# Patient Record
Sex: Female | Born: 1965 | Race: White | Hispanic: No | Marital: Married | Smoking: Former smoker
Health system: Southern US, Community
[De-identification: ages and names within clinical notes are randomized; demographics above are authoritative.]

## PROBLEM LIST (undated history)

## (undated) DIAGNOSIS — Z86010 Personal history of colonic polyps: Secondary | ICD-10-CM

## (undated) DIAGNOSIS — F419 Anxiety disorder, unspecified: Secondary | ICD-10-CM

## (undated) DIAGNOSIS — K219 Gastro-esophageal reflux disease without esophagitis: Secondary | ICD-10-CM

## (undated) DIAGNOSIS — F32A Depression, unspecified: Secondary | ICD-10-CM

## (undated) DIAGNOSIS — D649 Anemia, unspecified: Secondary | ICD-10-CM

## (undated) DIAGNOSIS — E785 Hyperlipidemia, unspecified: Secondary | ICD-10-CM

## (undated) DIAGNOSIS — N900 Mild vulvar dysplasia: Secondary | ICD-10-CM

## (undated) DIAGNOSIS — E559 Vitamin D deficiency, unspecified: Secondary | ICD-10-CM

## (undated) DIAGNOSIS — Z9889 Other specified postprocedural states: Secondary | ICD-10-CM

## (undated) DIAGNOSIS — F329 Major depressive disorder, single episode, unspecified: Secondary | ICD-10-CM

## (undated) DIAGNOSIS — R112 Nausea with vomiting, unspecified: Secondary | ICD-10-CM

## (undated) DIAGNOSIS — H9190 Unspecified hearing loss, unspecified ear: Secondary | ICD-10-CM

## (undated) HISTORY — DX: Anxiety disorder, unspecified: F41.9

## (undated) HISTORY — PX: TONSILLECTOMY AND ADENOIDECTOMY: SHX28

## (undated) HISTORY — DX: Gastro-esophageal reflux disease without esophagitis: K21.9

## (undated) HISTORY — DX: Mild vulvar dysplasia: N90.0

## (undated) HISTORY — DX: Major depressive disorder, single episode, unspecified: F32.9

## (undated) HISTORY — PX: ABDOMINAL SURGERY: SHX537

## (undated) HISTORY — PX: ESOPHAGOGASTRODUODENOSCOPY: SHX1529

## (undated) HISTORY — DX: Hyperlipidemia, unspecified: E78.5

## (undated) HISTORY — DX: Anemia, unspecified: D64.9

## (undated) HISTORY — DX: Nausea with vomiting, unspecified: R11.2

## (undated) HISTORY — PX: LASIK: SHX215

## (undated) HISTORY — DX: Vitamin D deficiency, unspecified: E55.9

## (undated) HISTORY — PX: WISDOM TOOTH EXTRACTION: SHX21

## (undated) HISTORY — DX: Depression, unspecified: F32.A

## (undated) HISTORY — PX: CO2 LASER OF LEUKOPLAKIA: SHX1364

## (undated) HISTORY — PX: ABDOMINOPLASTY: SUR9

## (undated) HISTORY — DX: Other specified postprocedural states: Z98.890

## (undated) HISTORY — PX: HAND SURGERY: SHX662

## (undated) HISTORY — DX: Personal history of colonic polyps: Z86.010

## (undated) HISTORY — DX: Unspecified hearing loss, unspecified ear: H91.90

---

## 1997-12-21 ENCOUNTER — Inpatient Hospital Stay (HOSPITAL_COMMUNITY): Admission: AD | Admit: 1997-12-21 | Discharge: 1997-12-24 | Payer: Self-pay | Admitting: Gynecology

## 1999-02-02 ENCOUNTER — Other Ambulatory Visit: Admission: RE | Admit: 1999-02-02 | Discharge: 1999-02-02 | Payer: Self-pay | Admitting: Gynecology

## 2000-03-05 ENCOUNTER — Other Ambulatory Visit: Admission: RE | Admit: 2000-03-05 | Discharge: 2000-03-05 | Payer: Self-pay | Admitting: *Deleted

## 2000-04-23 HISTORY — PX: TUBAL LIGATION: SHX77

## 2000-09-23 ENCOUNTER — Encounter (INDEPENDENT_AMBULATORY_CARE_PROVIDER_SITE_OTHER): Payer: Self-pay | Admitting: Specialist

## 2000-09-23 ENCOUNTER — Inpatient Hospital Stay (HOSPITAL_COMMUNITY): Admission: AD | Admit: 2000-09-23 | Discharge: 2000-09-26 | Payer: Self-pay | Admitting: *Deleted

## 2000-09-26 ENCOUNTER — Encounter (HOSPITAL_COMMUNITY): Admission: RE | Admit: 2000-09-26 | Discharge: 2000-10-26 | Payer: Self-pay | Admitting: Gynecology

## 2000-11-06 ENCOUNTER — Other Ambulatory Visit: Admission: RE | Admit: 2000-11-06 | Discharge: 2000-11-06 | Payer: Self-pay | Admitting: Gynecology

## 2001-01-21 HISTORY — PX: OTHER SURGICAL HISTORY: SHX169

## 2002-01-13 ENCOUNTER — Other Ambulatory Visit: Admission: RE | Admit: 2002-01-13 | Discharge: 2002-01-13 | Payer: Self-pay | Admitting: Gynecology

## 2003-02-26 ENCOUNTER — Other Ambulatory Visit: Admission: RE | Admit: 2003-02-26 | Discharge: 2003-02-26 | Payer: Self-pay | Admitting: Gynecology

## 2004-03-20 ENCOUNTER — Other Ambulatory Visit: Admission: RE | Admit: 2004-03-20 | Discharge: 2004-03-20 | Payer: Self-pay | Admitting: Gynecology

## 2004-06-09 ENCOUNTER — Ambulatory Visit (HOSPITAL_BASED_OUTPATIENT_CLINIC_OR_DEPARTMENT_OTHER): Admission: RE | Admit: 2004-06-09 | Discharge: 2004-06-09 | Payer: Self-pay | Admitting: Gynecology

## 2005-02-01 ENCOUNTER — Ambulatory Visit (HOSPITAL_BASED_OUTPATIENT_CLINIC_OR_DEPARTMENT_OTHER): Admission: RE | Admit: 2005-02-01 | Discharge: 2005-02-01 | Payer: Self-pay | Admitting: Gynecology

## 2005-02-01 ENCOUNTER — Encounter (INDEPENDENT_AMBULATORY_CARE_PROVIDER_SITE_OTHER): Payer: Self-pay | Admitting: *Deleted

## 2006-05-15 ENCOUNTER — Other Ambulatory Visit: Admission: RE | Admit: 2006-05-15 | Discharge: 2006-05-15 | Payer: Self-pay | Admitting: Gynecology

## 2006-05-17 ENCOUNTER — Encounter: Admission: RE | Admit: 2006-05-17 | Discharge: 2006-05-17 | Payer: Self-pay | Admitting: Family Medicine

## 2007-05-22 ENCOUNTER — Other Ambulatory Visit: Admission: RE | Admit: 2007-05-22 | Discharge: 2007-05-22 | Payer: Self-pay | Admitting: Gynecology

## 2007-06-11 ENCOUNTER — Encounter: Admission: RE | Admit: 2007-06-11 | Discharge: 2007-06-11 | Payer: Self-pay | Admitting: Gynecology

## 2007-06-16 ENCOUNTER — Ambulatory Visit: Payer: Self-pay | Admitting: Internal Medicine

## 2007-06-27 ENCOUNTER — Ambulatory Visit: Payer: Self-pay | Admitting: Internal Medicine

## 2007-07-02 ENCOUNTER — Encounter: Admission: RE | Admit: 2007-07-02 | Discharge: 2007-07-02 | Payer: Self-pay | Admitting: Gynecology

## 2008-06-14 ENCOUNTER — Other Ambulatory Visit: Admission: RE | Admit: 2008-06-14 | Discharge: 2008-06-14 | Payer: Self-pay | Admitting: Gynecology

## 2008-06-14 ENCOUNTER — Ambulatory Visit: Payer: Self-pay | Admitting: Gynecology

## 2008-06-14 ENCOUNTER — Encounter: Payer: Self-pay | Admitting: Gynecology

## 2008-09-15 ENCOUNTER — Encounter: Admission: RE | Admit: 2008-09-15 | Discharge: 2008-09-15 | Payer: Self-pay | Admitting: Gynecology

## 2008-09-21 ENCOUNTER — Encounter: Admission: RE | Admit: 2008-09-21 | Discharge: 2008-09-21 | Payer: Self-pay | Admitting: Gynecology

## 2009-10-12 ENCOUNTER — Other Ambulatory Visit: Admission: RE | Admit: 2009-10-12 | Discharge: 2009-10-12 | Payer: Self-pay | Admitting: Gynecology

## 2009-10-12 ENCOUNTER — Ambulatory Visit: Payer: Self-pay | Admitting: Gynecology

## 2009-10-18 ENCOUNTER — Encounter: Admission: RE | Admit: 2009-10-18 | Discharge: 2009-10-18 | Payer: Self-pay | Admitting: Gynecology

## 2009-11-02 ENCOUNTER — Ambulatory Visit: Payer: Self-pay | Admitting: Gynecology

## 2009-11-28 ENCOUNTER — Ambulatory Visit: Payer: Self-pay | Admitting: Gynecology

## 2009-11-29 ENCOUNTER — Ambulatory Visit: Payer: Self-pay | Admitting: Gynecology

## 2009-11-29 HISTORY — PX: ENDOMETRIAL ABLATION: SHX621

## 2009-12-13 ENCOUNTER — Ambulatory Visit: Payer: Self-pay | Admitting: Gynecology

## 2010-05-14 ENCOUNTER — Encounter: Payer: Self-pay | Admitting: Family Medicine

## 2010-05-14 ENCOUNTER — Encounter: Payer: Self-pay | Admitting: Gynecology

## 2010-09-05 NOTE — Assessment & Plan Note (Signed)
[facility]                         GASTROENTEROLOGY OFFICE NOTE   NAME:[name], [name]                        MRN:          [id number]  DATE:06/16/2007                            DOB:          1966-04-01    CHIEF COMPLAINT:  Swallowing problems.   HISTORY:  This is a 45 year old white woman that for the past one to two  years has described some intermittent solid food dysphagia; in fact, she  has had to regurgitate at times.  There is a background of some  heartburn symptoms.  She has used occasional antacids in the past but  has not been on any chronic proton pump inhibitor.  She drinks about 2  cups of coffee in the morning but that is it for caffeine.  She has had  heartburn and indigestion in the past and in 1997 she had an upper  gastrointestinal series in [place] and she believes she had a small  hiatal hernia.  Other triggers of her reflux are red wine and spicy  foods.  Her father probably had gastroesophageal reflux disease and a  hiatal hernia as well.   PAST MEDICAL HISTORY:  Notable for depression for three years, on  fluoxetine and Wellbutrin.  She has had a tubal ligation and she has a  history of cervical polyps.   FAMILY HISTORY:  As above. Mother had heart disease.   SOCIAL HISTORY:  She is married.  She is a Network engineer of mine.  She has  two sons.  She is college educated.  She uses some alcohol, no tobacco  or drugs otherwise.   REVIEW OF SYSTEMS:  Occasional night sweats, hearing difficulty.  Her  last menstrual period was May 23, 2007.  All other systems negative.   PHYSICAL EXAMINATION:  GENERAL:  Well-developed, well-nourished, but  thin.  Height 5 feet.  Weight 124 pounds.  VITAL SIGNS:  Blood pressure 100/68, pulse 60.  HEENT:  Eyes anicteric.  ENT: Normal posterior pharynx.  NECK:  Supple.  No thyromegaly or masses.  CHEST:  Clear.  HEART:  S1, S2 with no murmurs, rubs or gallops.  ABDOMEN:  Soft, nontender, no  organomegaly or masses.  LYMPHATIC:  No neck adenopathy.  EXTREMITIES:  No edema.  SKIN:  No rash.  NEUROLOGICAL:  She is alert and oriented x3.   MEDICATIONS:  Other medications include a multivitamin as well as the  Wellbutrin and fluoxetine.   ASSESSMENT:  Dysphagia, chronic heartburn symptoms, intermittent.  She  may even have some odynophagia type symptoms.  The most likely etiology  is gastroesophageal reflux disease and this was explained to the  patient.  The possibility of eosinophilic esophagitis is mentioned and  I will keep that in mind.   RECOMMENDATIONS AND PLANS:  1. Nexium 40 mg daily, samples given.  2. Upper GI endoscopy, possible esophageal dilation or esophageal      biopsy depending upon what is seen.  This is planned for June 27, 2007.     Iva Boop, MD,FACG  Electronically Signed    CEG/MedQ  DD:  06/16/2007  DT: 06/17/2007  Job #: [id number]

## 2010-09-08 NOTE — Op Note (Signed)
[facility]  Patient:    [name], [name]                        MRN: [id number] Proc. Date: 09/23/00 Adm. Date:  [id number] Attending:  Tonye Royalty                           Operative Report  PREOPERATIVE DIAGNOSES:       1. Term intrauterine pregnancy.                               2. Previous cesarean section.                               3. Requests elective repeat cesarean section.                               4. Requests elective permanent sterilization.  POSTOPERATIVE DIAGNOSES:      1. Term intrauterine pregnancy.                               2. Previous cesarean section.                               3. Requests elective repeat cesarean section.                               4. Requests elective permanent sterilization.  OPERATION:                    1. Repeat low uterine transverse cesarean                                  section.                               2. Bilateral sterilization procedure, Pomeroy                                  technique.  SURGEON:                      Juan H. Lily Peer, M.D.  ASSISTANTMarcial Pacas P. Fontaine, M.D.  ANESTHESIA:                   Spinal.  INDICATIONS:                  A 45 year old gravida 2, para 1, at 74 weeks estimated gestational age with previous cesarean section requesting elective repeat.  Also, patient requests elective permanent sterilization.  FINDINGS:                     Viable female infant, Apgars 9/9 with a weight of 6 pounds 8 ounces.  Nuchal cord x 1.  Clear amniotic fluid.  Normal internal pelvic anatomy.  DESCRIPTION OF PROCEDURE:     After the patient was adequately counselled, she was taken to the operating room where she underwent successful spinal anesthesia placement.  Her abdomen was prepped and draped in the usual sterile fashion.  Foley catheter was inserted and left for monitoring urine output. After the abdomen was prepped and  draped, a Pfannenstiel skin incision was made adjacent to the previous Pfannenstiel scar.  The incision was carried down through skin and subcutaneous tissue down to the rectus fascia whereby midline nick was made.  The fascia was incised in a transverse fashion.  The peritoneal cavity was entered cautiously.  The bladder flap was established. The lower uterine segment was incised in transverse fashion.  Clear amniotic fluid was present.  The newborns head was delivered with assistance of mild traction under vacuum extractor.  The nuchal cord was released, and the newborn was delivered and gave immediate cry.  The above-mentioned parameters as described above were given by the pediatricians who were in attendance after the cord had been doubly clamped and excised and the newborn had been passed off to the pediatrician.  After cord blood was obtained, the placenta was delivered form the intrauterine cavity.  The intrauterine cavity was then swabbed clear of remaining products of conception.  The uterus was exteriorized and remaining products of conception removed.  The transverse lower uterine incision was closed in a single layer fashion with 0 Vicryl suture in a locking stitch manner.  After this, attention was placed on the left fallopian tube, proximal one-third portion where a 2 cm segment was ligated with 3-0 plain catgut suture x 2 and the 2 cm segment was excised and passed off the operative field and remaining stumps Bovie cauterized.  A similar procedure was carried out on the contralateral side.  The uterus was placed back in to the abdominal cavity.  The pelvic cavity was copiously irrigated with normal saline solution.  After ascertaining adequate hemostasis, the visceral peritoneum was not reapproximated, but the fascia was closed with a running stitch of 0 Vicryl suture.  The subcutaneous tissue was Bovie cauterized.  The skin was reapproximated with skin clips followed  by placement of Xeroform gauze and 4 x 4 dressing.  The patient was transferred to the recovery room with stable vital signs.  Blood loss from the procedure was 750 cc.  IV fluid was 2100 cc of lactated Ringers, and urine output was 50 to 75 cc and clear. DD:  09/23/00 TD:  09/23/00 Job: [id number]

## 2010-09-08 NOTE — H&P (Signed)
NAME:  [name], [name]                 ACCOUNT NO.:  [id number]   MEDICAL RECORD NO.:  [id number]          PATIENT TYPE:  AMB   LOCATION:  [facility]                         FACILITY:  [facility]   PHYSICIAN:  Juan H. Lily Peer, M.D.DATE OF BIRTH:  [birth year]   DATE OF ADMISSION:  DATE OF DISCHARGE:                                HISTORY & PHYSICAL   CHIEF COMPLAINT:  1.  Dysfunctional uterine bleeding.  2.  Endometrial polyps.   HISTORY:  The patient is a 45 year old, gravida 2, para 2 who has a prior  history of tubal sterilization at the time of her last C-section.  The  patient was seen in the office on 12/27/2004 for a preoperative consultation  for further evaluation of her ongoing postcoital bleeding.  When she was  seen in the office on 12/08/2004, she had a pedunculated polyp which was  removed from her cervix and submitted for histological evaluation.  Pathology report demonstrated that it was benign.  She underwent a  sonohystogram on 12/27/2004 which demonstrated 2 posterior wall defects, 1  measuring 10 x 6 mm and the other 1 was 7 x 8 mm suggestive of additional  endometrial polyps.  The right ovary had a thin wall liquid free cyst  measuring 11 x 11 mm and the left ovary had a small, thin walled cyst with  low level echoes measuring 11 x 8 mm.  The patient was placed on Megace 20  mg twice daily which she was to start taking 2 weeks before her surgery.   PAST MEDICAL HISTORY:   ALLERGIES:  She denies any allergies.   She has had a history of VEN I, status post CO2 laser ablation.  A patient  with two prior cesarean sections, at the last 1 having a bilateral tubal  sterilization.   FAMILY HISTORY:  A great aunt with ovarian cancer.  Her mother with  cardiovascular disease, and mother died of lung cancer.   MEDICATIONS:  1.  The patient takes Wellbutrin 300 mg daily.  2.  She takes Prozac.  3.  She is on multivitamins.   OTHER OPERATIONS:  Abdominoplasty in 2005.   PHYSICAL EXAMINATION:  The patient weighs 106 pounds, height 5 feet tall,  blood pressure 110/68.  HEENT:  Was unremarkable.  NECK:  Supple.  Trachea midline.  No carotid bruits.  No thyromegaly.  LUNGS:  Clear to auscultation without rhonchi or wheezes.  HEART:  Regular rate and rhythm.  No murmurs or gallops.  BREASTS:  Exam not done.  ABDOMEN:  Soft and nontender without rebound or guarding.  PELVIC:  Bartholin, urethra, and Skene glands within normal limits.  Vagina  and cervix:  No visual discharge.  Uterus:  Anteverted, normal size, shape,  and consistency.  Adnexa without masses or tenderness.  RECTAL:  Exam not done.   ASSESSMENT:  A 45 year old, gravida 2, para 2, previous sterilization  procedure.  A patient with dysfunctional uterine bleeding, recent cervical  polyp removed from external os which was benign.  Follow-up sonohystogram  with additional intracavitary and endometrial polyps.  The patient is  scheduled to undergo resectoscopic polypectomy, D&C at the [facility] on Thursday, October 12.  The patient was counseled as to  risks, benefits, pros and cons of the operation.  All questions were  answered.   PLAN:  The patient is scheduled for a resectoscopic polypectomy and D&C at  the [facility] on Thursday, October 12, at 8:30 a.m.      Juan H. Lily Peer, M.D.  Electronically Signed     JHF/MEDQ  D:  01/30/2005  T:  01/30/2005  Job:  [id number]

## 2010-09-08 NOTE — Op Note (Signed)
NAME:  [name], [name]                 ACCOUNT NO.:  [id number]   MEDICAL RECORD NO.:  [id number]          PATIENT TYPE:  AMB   LOCATION:  [facility]                         FACILITY:  [facility]   PHYSICIAN:  Juan H. 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   PREOPERATIVE DIAGNOSIS:  Vulvar intraepithelial neoplasia I.   POSTOPERATIVE DIAGNOSIS:  Vulvar intraepithelial neoplasia I.   ANESTHESIA:  General endotracheal anesthesia.   PROCEDURE PERFORMED:  CO2 laser ablation of vulvar intraepithelial  neoplasia.   SURGEON:  Juan H. Lily Peer, M.D.   ANESTHESIA:  General endotracheal anesthesia.   DESCRIPTION OF OPERATION:  After the patient was adequately counseled, she  was taken to the operating room where she was placed in the high lithotomy  position.  After general endotracheal anesthesia was obtained, the vulvar  area was protected with wet, moistened towels.  The colposcope was brought  into view.  Acetic acid was applied to the external genitalia for  visualization of the previously biopsied labia majora lesion.  It was  evident that there was an extensive hyperpigmented region running through  the entire labia majora.  The CO2 laser was set on 6 watts continuous mode  and with the hand-held piece, in a brush-like technique, the entire region  of the labia majora between labia majora and labia minora were ablated to a  depth of approximately 2 mm.  Afterwards, Silvadene cream was applied to the  area, and the patient was extubated, transferred to recovery room with  stable vital signs.  She received 30 mg of Toradol en route to the recovery  room.  Blood loss was minimal, and fluid resuscitation consisted of 300 mL  of lactated Ringer's.      JHF/MEDQ  D:  06/09/2004  T:  06/09/2004  Job:  [id number]

## 2010-09-08 NOTE — H&P (Signed)
[facility]  Patient:    [name], [name]                          MRN: [id number] Adm. Date:  09/23/00 Attending:  Gaetano Hawthorne. Lily Peer, M.D.                         History and Physical  CHIEF COMPLAINT:              1. Term intrauterine pregnancy.                               2. Previous cesarean section, request for                                  elective repeat.                               3. Request for elective tubal sterilization                                  procedure.  HISTORY OF PRESENT ILLNESS:   The patient is a 45 year old, gravida 2, para 1 with an estimated date of confinement October 02, 2000.  Patient will be [redacted] weeks gestation at the time of her scheduled repeat cesarean section.  Patient as part of her prenatal visits was counseled due to the fact that she would have been 45 years of age at the time of delivery of her baby and that she would be at increased risk for genetic and chromosomal abnormality based on advanced maternal age and was offered genetic amniocentesis and had declined although she accepted a maternal serum alpha-fetoprotein with its limitations in patients with advanced maternal age.  Her prenatal course has essentially been unremarkable with the exception that she had called on May 10 that her child at a day care had been exposed to a child with foot disease and wanted to be tested.  She had parvovirus B19 titers done which indicated old exposure with positive IgG but negative IgM.  She has voiced also at time of her repeat cesarean section that she would like to proceed with elective permanent sterilization for which she was counseled, as well.  PAST MEDICAL HISTORY:         Patient with previous cesarean section secondary to breech presentation.  Patient denies any allergies.  She has otherwise been in good health and no family members with any medical problems reported.  REVIEW OF SYSTEMS:            See  hospital form.  PHYSICAL EXAMINATION:  GENERAL:                      A well-developed, well-nourished female.  HEENT:                        Unremarkable.  NECK:                         Supple.  Trachea midline.  No carotid bruits. No thyromegaly.  LUNGS:  Clear to auscultation without rhonchi or wheezes.  HEART:                        Regular rate and rhythm.  No murmurs or gallops.  BREASTS:                      Examination done during the first trimester reported to be normal.  PELVIC:                       Gravid uterus, vertex presentation by Thayer Ohm maneuver.  Fundal height 38 cm.  Cervix long, closed, and posterior.  EXTREMITIES:                  DTRs 1+.  Negative clonus, trace edema.  PRENATAL LABS:                O positive blood type, negative antibody screen. VDRL was nonreactive.  Rubella was immune.  Hepatitis B surface antigen was negative.  Alpha-fetoprotein was negative.  Diabetes screen was normal. Patient did have evidence of anemia for which she was on supplemental iron during her pregnancy and her GBS culture was negative.  ASSESSMENT:                   A 45 year old, gravida 2, para 1 at 40 weeks estimated gestational age with previous cesarean section secondary to breech requesting elective repeat.  Patient also requesting elective permanent sterilization at time of repeat cesarean section.  Patient was counseled as to the risks, benefits, pros and cons, and failure rates from tubal sterilization procedure and also from repeat cesarean section.  All questions were answered and will follow accordingly.  Patient is aware that she will not be able to have any more children after tubal sterilization procedure.  PLAN:                         Patient is scheduled for a repeat lower uterine segment transverse cesarean section along with bilateral tubal sterilization procedure on Monday, June 3, at 9:15 a.m., at [facility]. DD:  09/19/00 TD:  09/19/00 Job: [id number]

## 2010-09-08 NOTE — Discharge Summary (Signed)
[facility]  Patient:    [name], [name]                          MRN: [id number] Adm. Date:  09/22/00 Disc. Date: 09/26/00 Attending:  Douglass Rivers, M.D. Dictator:   Antony Contras, [facility]                           Discharge Summary  DISCHARGE DIAGNOSES:          1. Term intrauterine pregnancy.                               2. Previous cesarean section.                               3. Requests elective repeat cesarean section.                               4. Requests elective permanent sterilization.  PROCEDURE:                    Repeat low transverse cesarean section, bilateral tubal sterilization Pomeroy technique with delivery of a viable infant.  HISTORY OF PRESENT ILLNESS:   The patient is a 45 year old, gravida 2, para 1-0-0-1, with an LMP of October 27, 1999, [facility] October 02, 2000.  Prenatal risk factors include history of previous cesarean section, also advanced maternal age.  The patient did decline amniocentesis, but did accept maternal serum alpha fetoprotein screening with its limitations.  She also was exposed to Parvovirus in late pregnancy and had titers done which indicated old exposure with positive IgG and negative IgM.  PRENATAL LABORATORY DATA:     Blood type O positive, antibody screen negative. RPR, HBSAG, and HIV nonreactive.  Rubella immune.  MSAFP normal.  GBS negative.  The patient had also requested elective permanent sterilization and had discussed this during her prenatal course.  HOSPITAL COURSE:              The patient presented on September 22, 2000.  Repeat low transverse cesarean section and bilateral sterilization procedure Pomeroy technique performed by Dr. Saunders Glance and assisted by Dr. Audie Box under spinal anesthesia.  Findings included a viable female infant, Apgars 9 and 9, with weight of 6 pounds 8 ounces, nuchal cord x 1.  Clear amniotic fluid.  Normal maternal anatomy.  Postoperative course, the patient remained  afebrile, had no difficulty voiding, and was able to be discharged on her third postoperative day in satisfactory condition.  CBC; hematocrit 33.7, hemoglobin 11.3, WBC 11.4, platelets 170.  DISPOSITION:                  Follow up in six weeks.  Continue prenatal vitamins and iron.  Motrin and Tylox for pain. DD:  10/25/00 TD:  10/25/00 Job: [id number]

## 2010-09-08 NOTE — Op Note (Signed)
NAME:  TRULA, FREDE                 ACCOUNT NO.:  [id number]   MEDICAL RECORD NO.:  [id number]          PATIENT TYPE:  AMB   LOCATION:  [facility]                         FACILITY:  [facility]   PHYSICIAN:  Juan H. Lily Peer, M.D.DATE OF BIRTH:  1965/04/26   DATE OF PROCEDURE:  02/01/2005  DATE OF DISCHARGE:                                 OPERATIVE REPORT   SURGEON:  Juan H. Lily Peer, M.D.   INDICATIONS FOR OPERATION:  A 45 year old, gravida 2, para 2 with prior  history of tubal sterilization, had postcoital bleeding and was worked and  she was found to have a pedunculated polyp which was removed from her  cervix, submitted for histologic evaluation. The pathology report  demonstrated that it was benign. She underwent s sonohysterogram which  demonstrated two posterior wall defects, one measuring 10 x 6 mm and the  other one measuring 7 x 8 mm suggestive of addition endometrial polyps. The  patient has been on Megace 20 mg b.i.d. for 2 weeks prior to her surgery.   PREOPERATIVE DIAGNOSES:  1.  Dysfunctional bleeding.  2.  Endometrial polyps.   POSTOPERATIVE DIAGNOSES:  1.  Dysfunctional bleeding.  2.  Endometrial polyps.   ANESTHESIA:  General endotracheal anesthesia.   PROCEDURE PERFORMED:  Resectoscopic polypectomy.   FINDINGS:  The patient had two small posterior uterine polyps. Both tubal  ostia were identified, normal cervical canal.   DESCRIPTION OF OPERATION:  After the patient was adequately counseled, she  was taken to the operating room where she underwent a successful general  endotracheal anesthesia. She had received a gram of Cefotan for prophylaxis.  The day before the patient had a laminaria placed intracervically to  facilitate insertion of the operative resectoscope. The cervix required no  dilatation, the vagina and perineum were prepped and draped in the usual  sterile fashion. A single tooth tenaculum was placed on the anterior  cervical lip. The operative  resectoscope with a 90 degree wire loop was  inserted into the internal cervical os and into the uterine cavity. Two  percent sorbitol was the distending media. A thorough inspection of the  intrauterine cavity demonstrated normal appearing tubal ostia, normal-  appearing fundus, two small polypoid type lesions were noted in the lower  uterine segment which was resected with a resectoscope and passed off the  operative field for histological evaluation. Its base was cauterized,  no other abnormalities were noted. The single tooth tenaculum was removed.  The patient was extubated, transferred to recovery room with stable vital  signs. Blood loss was minimal. Fluid resuscitation consisted of 800 mL  of  lactated Ringer's and she was to receive 30 mg of Toradol in route to the  recovery room.      Juan H. Lily Peer, M.D.  Electronically Signed     JHF/MEDQ  D:  02/01/2005  T:  02/01/2005  Job:  [id number]

## 2010-09-08 NOTE — H&P (Signed)
NAME:  [name], [name]                 ACCOUNT NO.:  [id number]   MEDICAL RECORD NO.:  [id number]          PATIENT TYPE:  AMB   LOCATION:  [facility]                         FACILITY:  [facility]   PHYSICIAN:  Juan H. Lily Peer, M.D.DATE OF BIRTH:  June 28, 1965   DATE OF ADMISSION:  DATE OF DISCHARGE:                                HISTORY & PHYSICAL   DATE OF ADMISSION:  Patient is scheduled for surgery Friday, June 09, 2004 at [facility].   CHIEF COMPLAINT:  Vulvar intraepithelial neoplasia.   HISTORY OF PRESENT ILLNESS:  The patient is a 45 year old gravida 2, para 2,  who has had a previous tubal sterilization procedure and had been seen at  the office for her annual examination on March 20, 2004 and brought to  the attention of the family practitioner that she had a pigmented area near  the left inner labia.  She was subsequently seen in the office on March 22, 2004 and high on the left labia majora there was a pigmented area that  was flat and on questioning, the patient did not ever see this except her  husband.  To rule out the possibility of underlying melanoma a Keyes punch  biopsy was done on the area and the report came back left labia major biopsy  with squamous mucosa with squamous hyperplasia and focal koilocytotic atypia  consistent with low grade vulvar intraepithelial neoplasia (VIN-I).  With  these findings the patient will be taken to the operating room for CO2 laser  ablation of vulvar intraepithelial neoplasia.   PAST MEDICAL HISTORY:  Patient denies any problems.   ALLERGIES:  Patient denies any allergies.   PAST SURGICAL HISTORY:  She had Cesarean section in 1999 and 2002 with  subsequent tubal sterilization.  Patient had an abdominoplasty in 2005.   MEDICATIONS:  Patient is on Wellbutrin as well and Prozac for her anxiety  and depression.  She takes multivitamins.   PHYSICAL EXAMINATION:  VITAL SIGNS:  Patient weighs 106 pounds, is 5 feet  tall.  Blood pressure 110/68.  HEENT:  Unremarkable.  NECK: Supple. Trachea midline.  No carotid bruits.  No thyromegaly.  LUNGS:  Clear to auscultation without rhonchi or wheezes.  HEART:  Regular rate and rhythm with no murmurs or gallops.  BREAST:  Examination not done.  ABDOMEN:  Soft, nontender without rebound or guarding.  PELVIC:  Bartholin's, urethral and Skene's glands within normal limits with  the exception of the area on the left inner labia majora where the biopsy  site had demonstrated VIN-I.  Bimanual examination unremarkable.   ASSESSMENT:  45 year old gravida 2, para 2 with left labia majora VIN-I,  will undergo laser cO2 ablation of vulvar intraepithelial neoplasia.  The  risks, benefits, pros and cons were discussed with the patient and all  questions were answered.  Will follow accordingly.   PLAN:  The patient is scheduled for cO2 laser ablation of the vulva for  dysplasia on Friday, June 09, 2004 at 7:30 A.M. at [facility].      JHF/MEDQ  D:  06/06/2004  T:  06/06/2004  Job:  [id number]

## 2010-11-27 ENCOUNTER — Other Ambulatory Visit: Payer: Self-pay | Admitting: Gynecology

## 2010-11-27 DIAGNOSIS — Z1231 Encounter for screening mammogram for malignant neoplasm of breast: Secondary | ICD-10-CM

## 2010-12-14 ENCOUNTER — Ambulatory Visit: Payer: Self-pay

## 2010-12-18 ENCOUNTER — Encounter: Payer: Self-pay | Admitting: Anesthesiology

## 2010-12-20 ENCOUNTER — Ambulatory Visit (INDEPENDENT_AMBULATORY_CARE_PROVIDER_SITE_OTHER): Payer: BC Managed Care – PPO | Admitting: Gynecology

## 2010-12-20 ENCOUNTER — Encounter: Payer: Self-pay | Admitting: Gynecology

## 2010-12-20 ENCOUNTER — Other Ambulatory Visit (HOSPITAL_COMMUNITY)
Admission: RE | Admit: 2010-12-20 | Discharge: 2010-12-20 | Disposition: A | Discharge status: Home / Self Care | Payer: BC Managed Care – PPO | Source: Ambulatory Visit | Attending: Gynecology | Admitting: Gynecology

## 2010-12-20 VITALS — BP 104/66

## 2010-12-20 DIAGNOSIS — Z01419 Encounter for gynecological examination (general) (routine) without abnormal findings: Secondary | ICD-10-CM

## 2010-12-20 DIAGNOSIS — R82998 Other abnormal findings in urine: Secondary | ICD-10-CM

## 2010-12-20 DIAGNOSIS — F329 Major depressive disorder, single episode, unspecified: Secondary | ICD-10-CM

## 2010-12-20 DIAGNOSIS — R634 Abnormal weight loss: Secondary | ICD-10-CM

## 2010-12-20 DIAGNOSIS — Z13220 Encounter for screening for lipoid disorders: Secondary | ICD-10-CM

## 2010-12-20 DIAGNOSIS — F32A Depression, unspecified: Secondary | ICD-10-CM

## 2010-12-20 NOTE — Progress Notes (Signed)
[name] 1965-10-10 [id number]   History:    45 y.o.  for annual exam and was asymptomatic. Patient is running on a regular basis. She is taken her calcium and vitamin D. Last year she was weighing 133 pounds and weighed today at 118 pounds.  She does her monthly self breast examination. Her last mammogram was in June of 2011. She scheduled a mammogram in the next few weeks. Patient is having regular cycles. She had a cryoablation for menorrhagia in the past. She's also has had a tubal sterilization procedure in the past as well. Past medical history,surgical history, family history and social history were all reviewed and documented in the EPIC chart. ROS:  Was performed and pertinent positives and negatives are included in the history.  Exam: chaperone present Filed Vitals:   12/20/10 1401  BP: 104/66   @WEIGHT @ There is no height or weight on file to calculate BMI.  General appearance : Well developed well nourished female. Skin grossly normal HEENT: Neck supple, trachea midline Lungs: Clear to auscultation, no rhonchi or wheezes Heart: Regular rate and rhythm, no murmurs or gallops Breast:Examined in sitting and supine position were symmetrical in appearance, no palpable masses, to skin retraction, no nipple inversion, no nipple discharge and no axillary or supraclavicular lymphadenopathy Abdomen: no palpable masses or tenderness scar from abdominoplasty evidence Pelvic  Ext/BUS/vagina  normal   Cervix  normal   Uterus  anteverted, normal size, shape and contour, midline and mobile nontender   Adnexa  Without masses or tenderness  Anus and perineum  normal   Rectovaginal  normal sphincter tone without palpated masses or tenderness             Hemoccult not done     Assessment/Plan:  45 y.o. female for annual exam unremarkable. Patient has history of depression and anxiety for which she has been on Wellbutrin. She will stop by the lab so we can check her CBC cholesterol random  blood sugar TSH urinalysis along with a Pap smear done today. She is instructed to continue her calcium and vitamin D twice a day continue her monthly self breast examination and will see her in one year or when necessary.    Ok Edwards MD, 2:53 PM 12/20/2010   Casper Harrison H MD, 2:53 PM 12/20/2010

## 2011-01-03 ENCOUNTER — Telehealth: Payer: Self-pay | Admitting: *Deleted

## 2011-01-03 DIAGNOSIS — E7800 Pure hypercholesterolemia, unspecified: Secondary | ICD-10-CM

## 2011-01-03 NOTE — Telephone Encounter (Signed)
Message copied by Aura Camps on Wed Jan 03, 2011  9:21 AM ------      Message from: Ok Edwards      Created: Wed Dec 27, 2010 12:11 PM       [name], please inform patient that she needs a fasting lipid profile due to the fact that her total cholesterol was elevated at 244 normal range is less than 200.

## 2011-01-03 NOTE — Telephone Encounter (Signed)
Pt informed with the below note, order in computer.

## 2011-01-08 ENCOUNTER — Other Ambulatory Visit: Payer: BC Managed Care – PPO

## 2011-01-18 ENCOUNTER — Ambulatory Visit
Admission: RE | Admit: 2011-01-18 | Discharge: 2011-01-18 | Disposition: A | Discharge status: Home / Self Care | Payer: BC Managed Care – PPO | Source: Ambulatory Visit | Attending: Gynecology | Admitting: Gynecology

## 2011-01-18 DIAGNOSIS — Z1231 Encounter for screening mammogram for malignant neoplasm of breast: Secondary | ICD-10-CM

## 2011-01-25 ENCOUNTER — Other Ambulatory Visit: Payer: Self-pay | Admitting: Gynecology

## 2011-01-25 ENCOUNTER — Other Ambulatory Visit: Payer: Self-pay | Admitting: *Deleted

## 2011-01-25 DIAGNOSIS — N6310 Unspecified lump in the right breast, unspecified quadrant: Secondary | ICD-10-CM

## 2011-02-07 ENCOUNTER — Other Ambulatory Visit: Payer: BC Managed Care – PPO

## 2011-02-21 ENCOUNTER — Ambulatory Visit
Admission: RE | Admit: 2011-02-21 | Discharge: 2011-02-21 | Disposition: A | Discharge status: Home / Self Care | Payer: BC Managed Care – PPO | Source: Ambulatory Visit | Attending: Obstetrics and Gynecology | Admitting: Obstetrics and Gynecology

## 2011-02-21 DIAGNOSIS — N6310 Unspecified lump in the right breast, unspecified quadrant: Secondary | ICD-10-CM

## 2012-03-07 ENCOUNTER — Other Ambulatory Visit: Payer: Self-pay | Admitting: Obstetrics and Gynecology

## 2012-03-07 ENCOUNTER — Other Ambulatory Visit: Payer: Self-pay | Admitting: Gynecology

## 2012-03-07 DIAGNOSIS — Z1231 Encounter for screening mammogram for malignant neoplasm of breast: Secondary | ICD-10-CM

## 2012-03-25 ENCOUNTER — Encounter: Payer: BC Managed Care – PPO | Admitting: Gynecology

## 2012-04-17 ENCOUNTER — Ambulatory Visit: Payer: BC Managed Care – PPO

## 2012-05-21 ENCOUNTER — Ambulatory Visit
Admission: RE | Admit: 2012-05-21 | Discharge: 2012-05-21 | Disposition: A | Discharge status: Home / Self Care | Payer: BC Managed Care – PPO | Source: Ambulatory Visit | Attending: Gynecology | Admitting: Gynecology

## 2012-05-21 DIAGNOSIS — Z1231 Encounter for screening mammogram for malignant neoplasm of breast: Secondary | ICD-10-CM

## 2012-05-22 ENCOUNTER — Other Ambulatory Visit: Payer: Self-pay | Admitting: Gynecology

## 2012-05-22 DIAGNOSIS — R928 Other abnormal and inconclusive findings on diagnostic imaging of breast: Secondary | ICD-10-CM

## 2012-05-26 ENCOUNTER — Other Ambulatory Visit: Payer: Self-pay | Admitting: *Deleted

## 2012-05-26 DIAGNOSIS — N6310 Unspecified lump in the right breast, unspecified quadrant: Secondary | ICD-10-CM

## 2012-05-28 ENCOUNTER — Other Ambulatory Visit (HOSPITAL_COMMUNITY)
Admission: RE | Admit: 2012-05-28 | Discharge: 2012-05-28 | Disposition: A | Discharge status: Home / Self Care | Payer: BC Managed Care – PPO | Source: Ambulatory Visit | Attending: Gynecology | Admitting: Gynecology

## 2012-05-28 ENCOUNTER — Encounter: Payer: Self-pay | Admitting: Gynecology

## 2012-05-28 ENCOUNTER — Ambulatory Visit (INDEPENDENT_AMBULATORY_CARE_PROVIDER_SITE_OTHER): Payer: BC Managed Care – PPO | Admitting: Gynecology

## 2012-05-28 VITALS — BP 104/68 | Ht 59.75 in | Wt 132.5 lb

## 2012-05-28 DIAGNOSIS — Z01419 Encounter for gynecological examination (general) (routine) without abnormal findings: Secondary | ICD-10-CM | POA: Insufficient documentation

## 2012-05-28 DIAGNOSIS — Z8639 Personal history of other endocrine, nutritional and metabolic disease: Secondary | ICD-10-CM

## 2012-05-28 DIAGNOSIS — Z1151 Encounter for screening for human papillomavirus (HPV): Secondary | ICD-10-CM | POA: Insufficient documentation

## 2012-05-28 DIAGNOSIS — N841 Polyp of cervix uteri: Secondary | ICD-10-CM

## 2012-05-28 DIAGNOSIS — N900 Mild vulvar dysplasia: Secondary | ICD-10-CM | POA: Insufficient documentation

## 2012-05-28 DIAGNOSIS — Z862 Personal history of diseases of the blood and blood-forming organs and certain disorders involving the immune mechanism: Secondary | ICD-10-CM

## 2012-05-28 LAB — CBC WITH DIFFERENTIAL/PLATELET
Basophils Absolute: 0 10*3/uL (ref 0.0–0.1)
Basophils Relative: 0 % (ref 0–1)
MCHC: 33.9 g/dL (ref 30.0–36.0)
Neutro Abs: 3.1 10*3/uL (ref 1.7–7.7)
Neutrophils Relative %: 58 % (ref 43–77)
RDW: 12.9 % (ref 11.5–15.5)

## 2012-05-28 NOTE — Progress Notes (Signed)
[name] 20-Oct-1965 [id number]   History:    47 y.o.  for annual gyn exam with no complaints today. Review of patient's records indicated that she has had history of resectoscopic polypectomy along with endometrial ablation. Patient is also had prior history of tubal sterilization procedure. Her cycles are regular and very light. Her mammogram January 2014 had a suspicious area on the right breast and patient is in the process of scheduling a followup ultrasound.  Review of her record indicated that in 2005 patient had VIN I of the left labia majora treated with CO2 laser. Patient also has had history in the past vitamin D deficiency as well as anemia. She is currently taking R. supplementation. Last Pap smear was normal in 2012. Patient denies any past history of abnormal Pap smears. Patient states flu vaccine is up-to-date. Patient not certain of her Tdap vaccine is up-to-date.  Past medical history,surgical history, family history and social history were all reviewed and documented in the EPIC chart.  Gynecologic History Patient's last menstrual period was 05/23/2012. Contraception: tubal ligation Last Pap: 2012. Results were: normal Last mammogram: 2014. Results were: See above  Obstetric History OB History    Grav Para Term Preterm Abortions TAB SAB Ect Mult Living   2 2        2      # Outc Date GA Lbr Len/2nd Wgt Sex Del Anes PTL Lv   1 PAR            2 PAR                ROS: A ROS was performed and pertinent positives and negatives are included in the history.  GENERAL: No fevers or chills. HEENT: No change in vision, no earache, sore throat or sinus congestion. NECK: No pain or stiffness. CARDIOVASCULAR: No chest pain or pressure. No palpitations. PULMONARY: No shortness of breath, cough or wheeze. GASTROINTESTINAL: No abdominal pain, nausea, vomiting or diarrhea, melena or bright red blood per rectum. GENITOURINARY: No urinary frequency, urgency, hesitancy or dysuria.  MUSCULOSKELETAL: No joint or muscle pain, no back pain, no recent trauma. DERMATOLOGIC: No rash, no itching, no lesions. ENDOCRINE: No polyuria, polydipsia, no heat or cold intolerance. No recent change in weight. HEMATOLOGICAL: No anemia or easy bruising or bleeding. NEUROLOGIC: No headache, seizures, numbness, tingling or weakness. PSYCHIATRIC: No depression, no loss of interest in normal activity or change in sleep pattern.     Exam: chaperone present  BP 104/68  Ht 4' 11.75" (1.518 m)  Wt 132 lb 8 oz (60.102 kg)  BMI 26.09 kg/m2  LMP 05/23/2012  Body mass index is 26.09 kg/(m^2).  General appearance : Well developed well nourished female. No acute distress HEENT: Neck supple, trachea midline, no carotid bruits, no thyroidmegaly Lungs: Clear to auscultation, no rhonchi or wheezes, or rib retractions  Heart: Regular rate and rhythm, no murmurs or gallops Breast:Examined in sitting and supine position were symmetrical in appearance, no palpable masses or tenderness,  no skin retraction, no nipple inversion, no nipple discharge, no skin discoloration, no axillary or supraclavicular lymphadenopathy Abdomen: no palpable masses or tenderness, no rebound or guarding Extremities: no edema or skin discoloration or tenderness  Pelvic:  Bartholin, Urethra, Skene Glands: Within normal limits             Vagina: No gross lesions or discharge  Cervix: No gross lesions or discharge, cervical polyp  Uterus  anteverted, normal size, shape and consistency, non-tender and mobile  Adnexa  Without masses or tenderness  Anus and perineum  normal   Rectovaginal  normal sphincter tone without palpated masses or tenderness             Hemoccult not done     Assessment/Plan:  47 y.o. female for annual exam with a cervical polyp. She will return to the office in one to 2 weeks for sonohysterogram and we will remove the cervical polyp at that time. Pap smear was done today. The following labs were ordered  today: Vitamin D and CBC. Her primary physician at [facility] recently did her labs. She will check with them if she has had her Tdap vaccine. She was reminded to followup with a recommended ultrasound for the suspicious area on the right breast noted on mammogram recently.    Ok Edwards MD, 2:07 PM 05/28/2012

## 2012-05-28 NOTE — Patient Instructions (Addendum)

## 2012-05-29 ENCOUNTER — Encounter: Payer: Self-pay | Admitting: Gynecology

## 2012-05-29 LAB — VITAMIN D 25 HYDROXY (VIT D DEFICIENCY, FRACTURES): Vit D, 25-Hydroxy: 37 ng/mL (ref 30–89)

## 2012-06-04 ENCOUNTER — Ambulatory Visit
Admission: RE | Admit: 2012-06-04 | Discharge: 2012-06-04 | Disposition: A | Discharge status: Home / Self Care | Payer: BC Managed Care – PPO | Source: Ambulatory Visit | Attending: Gynecology | Admitting: Gynecology

## 2012-06-04 DIAGNOSIS — N6310 Unspecified lump in the right breast, unspecified quadrant: Secondary | ICD-10-CM

## 2012-06-06 ENCOUNTER — Ambulatory Visit: Payer: BC Managed Care – PPO | Admitting: Gynecology

## 2012-06-06 ENCOUNTER — Other Ambulatory Visit: Payer: BC Managed Care – PPO

## 2012-06-23 ENCOUNTER — Other Ambulatory Visit: Payer: BC Managed Care – PPO

## 2012-06-23 ENCOUNTER — Ambulatory Visit: Payer: BC Managed Care – PPO | Admitting: Gynecology

## 2012-07-02 ENCOUNTER — Other Ambulatory Visit: Payer: Self-pay | Admitting: Gynecology

## 2012-07-02 DIAGNOSIS — D251 Intramural leiomyoma of uterus: Secondary | ICD-10-CM

## 2012-07-02 DIAGNOSIS — N8300 Follicular cyst of ovary, unspecified side: Secondary | ICD-10-CM

## 2012-07-02 DIAGNOSIS — D259 Leiomyoma of uterus, unspecified: Secondary | ICD-10-CM

## 2012-07-02 DIAGNOSIS — N841 Polyp of cervix uteri: Secondary | ICD-10-CM

## 2012-07-07 ENCOUNTER — Ambulatory Visit: Payer: BC Managed Care – PPO | Admitting: Gynecology

## 2012-07-07 ENCOUNTER — Encounter: Payer: Self-pay | Admitting: Gynecology

## 2012-07-07 ENCOUNTER — Ambulatory Visit: Payer: BC Managed Care – PPO

## 2012-07-07 DIAGNOSIS — D251 Intramural leiomyoma of uterus: Secondary | ICD-10-CM

## 2012-07-07 DIAGNOSIS — N841 Polyp of cervix uteri: Secondary | ICD-10-CM

## 2012-07-07 DIAGNOSIS — D259 Leiomyoma of uterus, unspecified: Secondary | ICD-10-CM

## 2012-07-07 DIAGNOSIS — N8300 Follicular cyst of ovary, unspecified side: Secondary | ICD-10-CM

## 2012-07-07 NOTE — Progress Notes (Signed)
Patient presented to the office today for sonohysterogram as a result of an incidental finding at time of her annual exam with every fifth 2014 whereby cervical polyp was noted. Review of patient's records indicated that she has had history of resectoscopic polypectomy along with endometrial ablation. Patient is also had prior history of tubal sterilization procedure. Her cycles are regular and very light. Patient's recent labs consisting of vitamin D and CBC were within normal limits.  Ultrasound today: Uterus measured 8.8 x 5.3 x 4.1 cm transvaginal ultrasound and sonohysterogram demonstrated no intrauterine defect. Patient with history of endometrial ablation the past. 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 Right and left ovary otherwise normal.  The cervix was cleansed with Betadine solution. A ring forcep was placed on the base of the cervical polyp was twisted off its pedicle and submitted for histological evaluation. Silver nitrate was used for hemostasis.  Patient will be notified with the pathology report when it becomes available otherwise we will see her back in one year or when necessary.

## 2013-05-29 ENCOUNTER — Ambulatory Visit (INDEPENDENT_AMBULATORY_CARE_PROVIDER_SITE_OTHER): Payer: BC Managed Care – PPO | Admitting: Gynecology

## 2013-05-29 ENCOUNTER — Encounter: Payer: Self-pay | Admitting: Gynecology

## 2013-05-29 VITALS — BP 104/68 | Ht 59.5 in | Wt 134.0 lb

## 2013-05-29 DIAGNOSIS — Z8639 Personal history of other endocrine, nutritional and metabolic disease: Secondary | ICD-10-CM

## 2013-05-29 DIAGNOSIS — Z01419 Encounter for gynecological examination (general) (routine) without abnormal findings: Secondary | ICD-10-CM

## 2013-05-29 DIAGNOSIS — Z1159 Encounter for screening for other viral diseases: Secondary | ICD-10-CM

## 2013-05-29 NOTE — Progress Notes (Signed)
[name] 12/09/1965 [id number]   History:    48 y.o.  for annual gyn exam with no complaints today.Review of patient's records indicated that she has had history of resectoscopic polypectomy along with endometrial ablation. Patient is also had prior history of tubal sterilization procedure. Her cycles are regular and very light.  Review of her record indicated that in 2005 patient had VIN I of the left labia majora treated with CO2 laser. Patient also has had history in the past vitamin D deficiency as well as anemia. She is currently taking R. supplementation. Last Pap smear was normal in 2012. Patient denies any past history of abnormal Pap smears. Patient states flu vaccine is up-to-date.   Past medical history,surgical history, family history and social history were all reviewed and documented in the EPIC chart.  Gynecologic History Patient's last menstrual period was 05/14/2013. Contraception: tubal ligation Last Pap: 2014. Results were: normal Last mammogram: 2014. Results were: normal  Obstetric History OB History  Gravida Para Term Preterm AB SAB TAB Ectopic Multiple Living  2 2        2     # Outcome Date GA Lbr Len/2nd Weight Sex Delivery Anes PTL Lv  2 PAR           1 PAR                ROS: A ROS was performed and pertinent positives and negatives are included in the history.  GENERAL: No fevers or chills. HEENT: No change in vision, no earache, sore throat or sinus congestion. NECK: No pain or stiffness. CARDIOVASCULAR: No chest pain or pressure. No palpitations. PULMONARY: No shortness of breath, cough or wheeze. GASTROINTESTINAL: No abdominal pain, nausea, vomiting or diarrhea, melena or bright red blood per rectum. GENITOURINARY: No urinary frequency, urgency, hesitancy or dysuria. MUSCULOSKELETAL: No joint or muscle pain, no back pain, no recent trauma. DERMATOLOGIC: No rash, no itching, no lesions. ENDOCRINE: No polyuria, polydipsia, no heat or cold intolerance.  No recent change in weight. HEMATOLOGICAL: No anemia or easy bruising or bleeding. NEUROLOGIC: No headache, seizures, numbness, tingling or weakness. PSYCHIATRIC: No depression, no loss of interest in normal activity or change in sleep pattern.     Exam: chaperone present  BP 104/68  Ht 4' 11.5" (1.511 m)  Wt 134 lb (60.782 kg)  BMI 26.62 kg/m2  LMP 05/14/2013  Body mass index is 26.62 kg/(m^2).  General appearance : Well developed well nourished female. No acute distress HEENT: Neck supple, trachea midline, no carotid bruits, no thyroidmegaly Lungs: Clear to auscultation, no rhonchi or wheezes, or rib retractions  Heart: Regular rate and rhythm, no murmurs or gallops Breast:Examined in sitting and supine position were symmetrical in appearance, no palpable masses or tenderness,  no skin retraction, no nipple inversion, no nipple discharge, no skin discoloration, no axillary or supraclavicular lymphadenopathy Abdomen: no palpable masses or tenderness, no rebound or guarding Extremities: no edema or skin discoloration or tenderness  Pelvic:  Bartholin, Urethra, Skene Glands: Within normal limits             Vagina: No gross lesions or discharge  Cervix: No gross lesions or discharge  Uterus  anteverted, normal size, shape and consistency, non-tender and mobile  Adnexa  Without masses or tenderness  Anus and perineum  normal   Rectovaginal  normal sphincter tone without palpated masses or tenderness             Hemoccult indicating  Assessment/Plan:  48 y.o. female for annual exam who is counseled today on the detrimental effects of smoking although she smokes only 4 cigarettes during the weekend. Patient does not want to go on any treatment at this time says that she can quit at any time. The new Pap smear guidelines were discussed. Pap smear not done today. Her flu vaccine is up-to-date. The following labs will be ordered so she can come in the fasting state next week: Fasting  lipid profile, comprehensive metabolic panel, CBC, vitamin D level and urinalysis. Patient was reminded to schedule her mammogram the next few weeks since is overdue. She was reminded to do her monthly breast exam.  New CDC guidelines is recommending patients be tested once in her lifetime for hepatitis C antibody who were born between [birth year] through 1965. This was discussed with the patient today and has agreed to be tested today.  Note: This dictation was prepared with  Dragon/digital dictation along withSmart phrase technology. Any transcriptional errors that result from this process are unintentional.   Terrance Mass MD, 11:31 AM 05/29/2013

## 2013-05-30 LAB — URINALYSIS W MICROSCOPIC + REFLEX CULTURE
Bilirubin Urine: NEGATIVE
CASTS: NONE SEEN
Crystals: NONE SEEN
Glucose, UA: NEGATIVE mg/dL
HGB URINE DIPSTICK: NEGATIVE
KETONES UR: NEGATIVE mg/dL
Leukocytes, UA: NEGATIVE
NITRITE: NEGATIVE
PROTEIN: NEGATIVE mg/dL
Specific Gravity, Urine: 1.021 (ref 1.005–1.030)
Squamous Epithelial / LPF: NONE SEEN
UROBILINOGEN UA: 0.2 mg/dL (ref 0.0–1.0)
pH: 5.5 (ref 5.0–8.0)

## 2013-08-17 ENCOUNTER — Other Ambulatory Visit: Payer: Self-pay

## 2013-08-17 DIAGNOSIS — Z1231 Encounter for screening mammogram for malignant neoplasm of breast: Secondary | ICD-10-CM

## 2013-09-03 ENCOUNTER — Ambulatory Visit: Payer: BC Managed Care – PPO

## 2013-10-06 ENCOUNTER — Encounter (INDEPENDENT_AMBULATORY_CARE_PROVIDER_SITE_OTHER): Payer: Self-pay

## 2013-10-06 ENCOUNTER — Ambulatory Visit
Admission: RE | Admit: 2013-10-06 | Discharge: 2013-10-06 | Disposition: A | Discharge status: Home / Self Care | Payer: BC Managed Care – PPO | Source: Ambulatory Visit

## 2013-10-06 DIAGNOSIS — Z1231 Encounter for screening mammogram for malignant neoplasm of breast: Secondary | ICD-10-CM

## 2013-12-24 ENCOUNTER — Encounter: Payer: Self-pay | Admitting: Internal Medicine

## 2014-02-05 ENCOUNTER — Other Ambulatory Visit: Payer: Self-pay

## 2014-02-22 ENCOUNTER — Encounter: Payer: Self-pay | Admitting: Gynecology

## 2014-06-23 ENCOUNTER — Ambulatory Visit (INDEPENDENT_AMBULATORY_CARE_PROVIDER_SITE_OTHER): Payer: BLUE CROSS/BLUE SHIELD | Admitting: Gynecology

## 2014-06-23 ENCOUNTER — Encounter: Payer: Self-pay | Admitting: Gynecology

## 2014-06-23 VITALS — BP 120/78 | Ht 60.0 in | Wt 140.0 lb

## 2014-06-23 DIAGNOSIS — Z8639 Personal history of other endocrine, nutritional and metabolic disease: Secondary | ICD-10-CM

## 2014-06-23 DIAGNOSIS — Z8659 Personal history of other mental and behavioral disorders: Secondary | ICD-10-CM

## 2014-06-23 DIAGNOSIS — N900 Mild vulvar dysplasia: Secondary | ICD-10-CM | POA: Diagnosis not present

## 2014-06-23 DIAGNOSIS — Z01419 Encounter for gynecological examination (general) (routine) without abnormal findings: Secondary | ICD-10-CM

## 2014-06-23 DIAGNOSIS — N951 Menopausal and female climacteric states: Secondary | ICD-10-CM | POA: Diagnosis not present

## 2014-06-23 LAB — CBC WITH DIFFERENTIAL/PLATELET
BASOS ABS: 0 10*3/uL (ref 0.0–0.1)
Basophils Relative: 0 % (ref 0–1)
EOS ABS: 0.1 10*3/uL (ref 0.0–0.7)
Eosinophils Relative: 2 % (ref 0–5)
HCT: 41.5 % (ref 36.0–46.0)
HEMOGLOBIN: 13.5 g/dL (ref 12.0–15.0)
LYMPHS ABS: 1.8 10*3/uL (ref 0.7–4.0)
Lymphocytes Relative: 24 % (ref 12–46)
MCH: 31 pg (ref 26.0–34.0)
MCHC: 32.5 g/dL (ref 30.0–36.0)
MCV: 95.2 fL (ref 78.0–100.0)
MONO ABS: 0.4 10*3/uL (ref 0.1–1.0)
MONOS PCT: 6 % (ref 3–12)
MPV: 10.4 fL (ref 8.6–12.4)
Neutro Abs: 5 10*3/uL (ref 1.7–7.7)
Neutrophils Relative %: 68 % (ref 43–77)
Platelets: 293 10*3/uL (ref 150–400)
RBC: 4.36 MIL/uL (ref 3.87–5.11)
RDW: 13.1 % (ref 11.5–15.5)
WBC: 7.3 10*3/uL (ref 4.0–10.5)

## 2014-06-23 LAB — TSH: TSH: 1.294 u[IU]/mL (ref 0.350–4.500)

## 2014-06-23 MED ORDER — FLUOXETINE HCL 10 MG PO CAPS: 20.0000 mg | ORAL_CAPSULE | Freq: Every day | ORAL | Status: DC

## 2014-06-23 MED ORDER — BUPROPION HCL ER (XL) 300 MG PO TB24: 300.0000 mg | ORAL_TABLET | Freq: Every day | ORAL | Status: DC

## 2014-06-23 NOTE — Progress Notes (Signed)
[name] 1966/02/22 [id number]   History:    49 y.o.  for annual gyn exam with the only complaint being that her cycle is late for 12 days. She has suffer from occasional vasomotor symptoms. Review of patient's records indicated that she has had history of resectoscopic polypectomy along with endometrial ablation. Patient is also had prior history of tubal sterilization procedure. 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 Patient also has had history in the past vitamin D deficiency as well as anemia. Patient denies any past history of any abnormal Pap smear. Her vaccines are up-to-date. Last year she forgot to come to the office for fasting lab work. Patient currently being followed monthly by her therapist who has recommended that she take Wellbutrin and Prozac daily for which she was requesting refill today.  Past medical history,surgical history, family history and social history were all reviewed and documented in the EPIC chart.  Gynecologic History Patient's last menstrual period was 05/15/2014. Contraception: tubal ligation Last Pap: 2014. Results were: normal Last mammogram: 2015. Results were: normal  Obstetric History OB History  Gravida Para Term Preterm AB SAB TAB Ectopic Multiple Living  2 2        2     # Outcome Date GA Lbr Len/2nd Weight Sex Delivery Anes PTL Lv  2 Para           1 Para                ROS: A ROS was performed and pertinent positives and negatives are included in the history.  GENERAL: No fevers or chills. HEENT: No change in vision, no earache, sore throat or sinus congestion. NECK: No pain or stiffness. CARDIOVASCULAR: No chest pain or pressure. No palpitations. PULMONARY: No shortness of breath, cough or wheeze. GASTROINTESTINAL: No abdominal pain, nausea, vomiting or diarrhea, melena or bright red blood per rectum.  GENITOURINARY: No urinary frequency, urgency, hesitancy or dysuria. MUSCULOSKELETAL: No joint or muscle pain, no back pain, no recent trauma. DERMATOLOGIC: No rash, no itching, no lesions. ENDOCRINE: No polyuria, polydipsia, no heat or cold intolerance. No recent change in weight. HEMATOLOGICAL: No anemia or easy bruising or bleeding. NEUROLOGIC: No headache, seizures, numbness, tingling or weakness. PSYCHIATRIC: No depression, no loss of interest in normal activity or change in sleep pattern.     Exam: chaperone present  BP 120/78 mmHg  Ht 5' (1.524 m)  Wt 140 lb (63.504 kg)  BMI 27.34 kg/m2  LMP 05/15/2014  Body mass index is 27.34 kg/(m^2).  General appearance : Well developed well nourished female. No acute distress HEENT: Eyes: no retinal hemorrhage or exudates,  Neck supple, trachea midline, no carotid bruits, no thyroidmegaly Lungs: Clear to auscultation, no rhonchi or wheezes, or rib retractions  Heart: Regular rate and rhythm, no murmurs or gallops Breast:Examined in sitting and supine position were symmetrical in appearance, no palpable masses or tenderness,  no skin retraction, no nipple inversion, no nipple discharge, no skin discoloration, no axillary or supraclavicular lymphadenopathy Abdomen: no palpable masses or tenderness, no rebound or guarding Extremities: no edema or skin discoloration or tenderness  Pelvic:  Bartholin, Urethra, Skene Glands: Within normal limits             Vagina: No gross lesions or discharge  Cervix: No gross lesions or discharge  Uterus  anteverted, normal size, shape  and consistency, non-tender and mobile  Adnexa  Without masses or tenderness  Anus and perineum  normal   Rectovaginal  normal sphincter tone without palpated masses or tenderness             Hemoccult not indicated     Assessment/Plan:  49 y.o. female for annual exam who appears to be perimenopausal. We will check an [place] level today along with the following screening blood  work: Comprehensive metabolic panel, TSH, fasting lipid profile, CBC, and urinalysis. Because of her past history vitamin D deficiency will check a vitamin D level today. Patient will not need a Pap smear until next year. She was reminded to do her monthly breast exams. She was reminded to schedule her mammogram this year. We discussed importance of calcium vitamin D and regular exercise for osteoporosis prevention.   Terrance Mass MD, 12:35 PM 06/23/2014

## 2014-06-23 NOTE — Patient Instructions (Signed)
Hormone Therapy At menopause, your body begins making less estrogen and progesterone hormones. This causes the body to stop having menstrual periods. This is because estrogen and progesterone hormones control your periods and menstrual cycle. A lack of estrogen may cause symptoms such as:  Hot flushes (or hot flashes).  Vaginal dryness.  Dry skin.  Loss of sex drive.  Risk of bone loss (osteoporosis). When this happens, you may choose to take hormone therapy to get back the estrogen lost during menopause. When the hormone estrogen is given alone, it is usually referred to as ET (Estrogen Therapy). When the hormone progestin is combined with estrogen, it is generally called HT (Hormone Therapy). This was formerly known as hormone replacement therapy (HRT). Your caregiver can help you make a decision on what will be best for you. The decision to use HT seems to change often as new studies are done. Many studies do not agree on the benefits of hormone replacement therapy. LIKELY BENEFITS OF HT INCLUDE PROTECTION FROM:  Hot Flushes (also called hot flashes) - A hot flush is a sudden feeling of heat that spreads over the face and body. The skin may redden like a blush. It is connected with sweats and sleep disturbance. Women going through menopause may have hot flushes a few times a month or several times per day depending on the woman.  Osteoporosis (bone loss)- Estrogen helps guard against bone loss. After menopause, a woman's bones slowly lose calcium and become weak and brittle. As a result, bones are more likely to break. The hip, wrist, and spine are affected most often. Hormone therapy can help slow bone loss after menopause. Weight bearing exercise and taking calcium with vitamin D also can help prevent bone loss. There are also medications that your caregiver can prescribe that can help prevent osteoporosis.  Vaginal Dryness - Loss of estrogen causes changes in the vagina. Its lining may  become thin and dry. These changes can cause pain and bleeding during sexual intercourse. Dryness can also lead to infections. This can cause burning and itching. (Vaginal estrogen treatment can help relieve pain, itching, and dryness.)  Urinary Tract Infections are more common after menopause because of lack of estrogen. Some women also develop urinary incontinence because of low estrogen levels in the vagina and bladder.  Possible other benefits of estrogen include a positive effect on mood and short-term memory in women. RISKS AND COMPLICATIONS  Using estrogen alone without progesterone causes the lining of the uterus to grow. This increases the risk of lining of the uterus (endometrial) cancer. Your caregiver should give another hormone called progestin if you have a uterus.  Women who take combined (estrogen and progestin) HT appear to have an increased risk of breast cancer. The risk appears to be small, but increases throughout the time that HT is taken.  Combined therapy also makes the breast tissue slightly denser which makes it harder to read mammograms (breast X-rays).  Combined, estrogen and progesterone therapy can be taken together every day, in which case there may be spotting of blood. HT therapy can be taken cyclically in which case you will have menstrual periods. Cyclically means HT is taken for a set amount of days, then not taken, then this process is repeated.  HT may increase the risk of stroke, heart attack, breast cancer and forming blood clots in your leg.  Transdermal estrogen (estrogen that is absorbed through the skin with a patch or a cream) may have more positive results with:    Cholesterol.  Blood pressure.  Blood clots. Having the following conditions may indicate you should not have HT:  Endometrial cancer.  Liver disease.  Breast cancer.  Heart disease.  History of blood clots.  Stroke. TREATMENT   If you choose to take HT and have a uterus,  usually estrogen and progestin are prescribed.  Your caregiver will help you decide the best way to take the medications.  Possible ways to take estrogen include:  Pills.  Patches.  Gels.  Sprays.  Vaginal estrogen cream, rings and tablets.  It is best to take the lowest dose possible that will help your symptoms and take them for the shortest period of time that you can.  Hormone therapy can help relieve some of the problems (symptoms) that affect women at menopause. Before making a decision about HT, talk to your caregiver about what is best for you. Be well informed and comfortable with your decisions. HOME CARE INSTRUCTIONS   Follow your caregivers advice when taking the medications.  A Pap test is done to screen for cervical cancer.  The first Pap test should be done at age 21.  Between ages 21 and 29, Pap tests are repeated every 2 years.  Beginning at age 30, you are advised to have a Pap test every 3 years as long as your past 3 Pap tests have been normal.  Some women have medical problems that increase the chance of getting cervical cancer. Talk to your caregiver about these problems. It is especially important to talk to your caregiver if a new problem develops soon after your last Pap test. In these cases, your caregiver may recommend more frequent screening and Pap tests.  The above recommendations are the same for women who have or have not gotten the vaccine for HPV (Human Papillomavirus).  If you had a hysterectomy for a problem that was not a cancer or a condition that could lead to cancer, then you no longer need Pap tests. However, even if you no longer need a Pap test, a regular exam is a good idea to make sure no other problems are starting.   If you are between ages 65 and 70, and you have had normal Pap tests going back 10 years, you no longer need Pap tests. However, even if you no longer need a Pap test, a regular exam is a good idea to make sure no  other problems are starting.   If you have had past treatment for cervical cancer or a condition that could lead to cancer, you need Pap tests and screening for cancer for at least 20 years after your treatment.  If Pap tests have been discontinued, risk factors (such as a new sexual partner) need to be re-assessed to determine if screening should be resumed.  Some women may need screenings more often if they are at high risk for cervical cancer.  Get mammograms done as per the advice of your caregiver. SEEK IMMEDIATE MEDICAL CARE IF:  You develop abnormal vaginal bleeding.  You have pain or swelling in your legs, shortness of breath, or chest pain.  You develop dizziness or headaches.  You have lumps or changes in your breasts or armpits.  You have slurred speech.  You develop weakness or numbness of your arms or legs.  You have pain, burning, or bleeding when urinating.  You develop abdominal pain. Document Released: 01/06/2003 Document Revised: 07/02/2011 Document Reviewed: 04/26/2010 [employer] Patient Information 2015 [employer]. This information is not intended to   replace advice given to you by your health care provider. Make sure you discuss any questions you have with your health care provider. Perimenopause Perimenopause is the time when your body begins to move into the menopause (no menstrual period for 12 straight months). It is a natural process. Perimenopause can begin 2-8 years before the menopause and usually lasts for 1 year after the menopause. During this time, your ovaries may or may not produce an egg. The ovaries vary in their production of estrogen and progesterone hormones each month. This can cause irregular menstrual periods, difficulty getting pregnant, vaginal bleeding between periods, and uncomfortable symptoms. CAUSES  Irregular production of the ovarian hormones, estrogen and progesterone, and not ovulating every month.  Other causes  include:  Tumor of the pituitary gland in the brain.  Medical disease that affects the ovaries.  Radiation treatment.  Chemotherapy.  Unknown causes.  Heavy smoking and excessive alcohol intake can bring on perimenopause sooner. SIGNS AND SYMPTOMS   Hot flashes.  Night sweats.  Irregular menstrual periods.  Decreased sex drive.  Vaginal dryness.  Headaches.  Mood swings.  Depression.  Memory problems.  Irritability.  Tiredness.  Weight gain.  Trouble getting pregnant.  The beginning of losing bone cells (osteoporosis).  The beginning of hardening of the arteries (atherosclerosis). DIAGNOSIS  Your health care provider will make a diagnosis by analyzing your age, menstrual history, and symptoms. He or she will do a physical exam and note any changes in your body, especially your female organs. Female hormone tests may or may not be helpful depending on the amount of female hormones you produce and when you produce them. However, other hormone tests may be helpful to rule out other problems. TREATMENT  In some cases, no treatment is needed. The decision on whether treatment is necessary during the perimenopause should be made by you and your health care provider based on how the symptoms are affecting you and your lifestyle. Various treatments are available, such as:  Treating individual symptoms with a specific medicine for that symptom.  Herbal medicines that can help specific symptoms.  Counseling.  Group therapy. HOME CARE INSTRUCTIONS   Keep track of your menstrual periods (when they occur, how heavy they are, how long between periods, and how long they last) as well as your symptoms and when they started.  Only take over-the-counter or prescription medicines as directed by your health care provider.  Sleep and rest.  Exercise.  Eat a diet that contains calcium (good for your bones) and soy (acts like the estrogen hormone).  Do not smoke.  Avoid  alcoholic beverages.  Take vitamin supplements as recommended by your health care provider. Taking vitamin E may help in certain cases.  Take calcium and vitamin D supplements to help prevent bone loss.  Group therapy is sometimes helpful.  Acupuncture may help in some cases. SEEK MEDICAL CARE IF:   You have questions about any symptoms you are having.  You need a referral to a specialist (gynecologist, psychiatrist, or psychologist). SEEK IMMEDIATE MEDICAL CARE IF:   You have vaginal bleeding.  Your period lasts longer than 8 days.  Your periods are recurring sooner than 21 days.  You have bleeding after intercourse.  You have severe depression.  You have pain when you urinate.  You have severe headaches.  You have vision problems. Document Released: 05/17/2004 Document Revised: 01/28/2013 Document Reviewed: 11/06/2012 [facility] Patient Information 2015 [place], [place]. This information is not intended to replace advice given to  to you by your health care provider. Make sure you discuss any questions you have with your health care provider.  

## 2014-06-24 LAB — COMPREHENSIVE METABOLIC PANEL
ALBUMIN: 4.4 g/dL (ref 3.5–5.2)
ALT: 27 U/L (ref 0–35)
AST: 20 U/L (ref 0–37)
Alkaline Phosphatase: 47 U/L (ref 39–117)
BILIRUBIN TOTAL: 0.5 mg/dL (ref 0.2–1.2)
BUN: 18 mg/dL (ref 6–23)
CHLORIDE: 102 meq/L (ref 96–112)
CO2: 23 meq/L (ref 19–32)
Calcium: 9.3 mg/dL (ref 8.4–10.5)
Creat: 0.86 mg/dL (ref 0.50–1.10)
Glucose, Bld: 103 mg/dL — ABNORMAL HIGH (ref 70–99)
POTASSIUM: 4 meq/L (ref 3.5–5.3)
SODIUM: 138 meq/L (ref 135–145)
TOTAL PROTEIN: 6.9 g/dL (ref 6.0–8.3)

## 2014-06-24 LAB — URINALYSIS W MICROSCOPIC + REFLEX CULTURE
BILIRUBIN URINE: NEGATIVE
Bacteria, UA: NONE SEEN
Casts: NONE SEEN
Crystals: NONE SEEN
Glucose, UA: NEGATIVE mg/dL
Hgb urine dipstick: NEGATIVE
KETONES UR: NEGATIVE mg/dL
Leukocytes, UA: NEGATIVE
Nitrite: NEGATIVE
Protein, ur: NEGATIVE mg/dL
Specific Gravity, Urine: 1.023 (ref 1.005–1.030)
Urobilinogen, UA: 0.2 mg/dL (ref 0.0–1.0)
pH: 5.5 (ref 5.0–8.0)

## 2014-06-24 LAB — VITAMIN D 25 HYDROXY (VIT D DEFICIENCY, FRACTURES): VIT D 25 HYDROXY: 39 ng/mL (ref 30–100)

## 2014-06-24 LAB — LIPID PANEL
CHOLESTEROL: 236 mg/dL — AB (ref 0–200)
HDL: 78 mg/dL (ref >=46)
LDL Cholesterol: 140 mg/dL — ABNORMAL HIGH (ref 0–99)
Total CHOL/HDL Ratio: 3 Ratio
Triglycerides: 89 mg/dL (ref <150)
VLDL: 18 mg/dL (ref 0–40)

## 2014-06-25 ENCOUNTER — Telehealth: Payer: Self-pay

## 2014-06-25 ENCOUNTER — Other Ambulatory Visit: Payer: Self-pay | Admitting: Gynecology

## 2014-06-25 DIAGNOSIS — R7309 Other abnormal glucose: Secondary | ICD-10-CM

## 2014-06-25 DIAGNOSIS — N951 Menopausal and female climacteric states: Secondary | ICD-10-CM

## 2014-06-25 DIAGNOSIS — E7800 Pure hypercholesterolemia, unspecified: Secondary | ICD-10-CM

## 2014-06-25 NOTE — Telephone Encounter (Signed)
-----   Message from Terrance Mass, MD sent at 06/24/2014  7:45 AM EST ----- Please inform patient that her lipid profile indicated that her total cholesterol was over 200 with a value of 236. Her low density lipoprotein cholesterol was elevated at 140 normal as less than 99 the rest of her lipid profile was normal. Please send her a cholesterol lowering diet handout and encourage exercise at least 1 hour 3-4 times a week and we'll need to repeat her lipid profile in a fasting state as well as I would like to check her sugar which was slightly above normal with a value 103 cutoff being 99.

## 2014-06-25 NOTE — Telephone Encounter (Signed)
Patient asked how her estrogen level was. You did not order that but she thought you were.  She wondered if when she returns for the FLP, and FBS if you would be okay with her having it drawn then.

## 2014-06-25 NOTE — Telephone Encounter (Signed)
Cancelled encounter order is in chart at visit.

## 2015-02-22 ENCOUNTER — Ambulatory Visit: Payer: Self-pay | Admitting: Internal Medicine

## 2015-02-22 ENCOUNTER — Encounter: Payer: Self-pay | Admitting: Internal Medicine

## 2015-02-22 NOTE — Progress Notes (Signed)
Patient ID: [name], female   DOB: 19-Mar-1966, 49 y.o.   MRN: [id number]  We noticed you missed your appointment. Doctor Carlean Purl Date 02/22/15 Time 9:00AM Your doctor hs recommended that you return to the practice so that effective healthcare can be provided to you if you feel you still need the appointment.  Please contact us at [phone] to schedule another appointment.  Left message on patient's voice mail to call back and r/s.

## 2015-03-14 ENCOUNTER — Other Ambulatory Visit: Payer: Self-pay

## 2015-03-14 DIAGNOSIS — Z1231 Encounter for screening mammogram for malignant neoplasm of breast: Secondary | ICD-10-CM

## 2015-04-15 ENCOUNTER — Ambulatory Visit
Admission: RE | Admit: 2015-04-15 | Discharge: 2015-04-15 | Disposition: A | Discharge status: Home / Self Care | Payer: BLUE CROSS/BLUE SHIELD | Source: Ambulatory Visit

## 2015-04-15 DIAGNOSIS — Z1231 Encounter for screening mammogram for malignant neoplasm of breast: Secondary | ICD-10-CM

## 2015-07-19 ENCOUNTER — Other Ambulatory Visit: Payer: Self-pay | Admitting: Gynecology

## 2015-08-04 ENCOUNTER — Other Ambulatory Visit: Payer: Self-pay | Admitting: Gynecology

## 2015-09-07 ENCOUNTER — Ambulatory Visit (INDEPENDENT_AMBULATORY_CARE_PROVIDER_SITE_OTHER): Payer: BLUE CROSS/BLUE SHIELD | Admitting: Gynecology

## 2015-09-07 ENCOUNTER — Encounter: Payer: Self-pay | Admitting: Gynecology

## 2015-09-07 VITALS — BP 112/70 | Ht 59.75 in | Wt 125.0 lb

## 2015-09-07 DIAGNOSIS — Z01419 Encounter for gynecological examination (general) (routine) without abnormal findings: Secondary | ICD-10-CM | POA: Diagnosis not present

## 2015-09-07 DIAGNOSIS — Z87412 Personal history of vulvar dysplasia: Secondary | ICD-10-CM | POA: Diagnosis not present

## 2015-09-07 DIAGNOSIS — N943 Premenstrual tension syndrome: Secondary | ICD-10-CM | POA: Diagnosis not present

## 2015-09-07 DIAGNOSIS — N951 Menopausal and female climacteric states: Secondary | ICD-10-CM

## 2015-09-07 DIAGNOSIS — R232 Flushing: Secondary | ICD-10-CM

## 2015-09-07 MED ORDER — BUPROPION HCL ER (XL) 300 MG PO TB24: 300.0000 mg | ORAL_TABLET | Freq: Every day | ORAL | Status: DC

## 2015-09-07 MED ORDER — FLUOXETINE HCL 10 MG PO CAPS: 10.0000 mg | ORAL_CAPSULE | Freq: Every day | ORAL | Status: DC

## 2015-09-07 NOTE — Progress Notes (Signed)
[name] [birth year] [id number]   History:    50 y.o.  for annual gyn exam with complaint of not having a menstrual cycle since March of this year. She occasionally gets some night sweats but not during the day. Patient still has not had her colonoscopy.Review of patient's records indicated that she has had history of resectoscopic polypectomy along with endometrial ablation. Patient is also had prior history of tubal sterilization procedure. 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 Patient also has had history in the past vitamin D deficiency as well as anemia. Patient denies any past history of any abnormal Pap smear. Her vaccines are up-to-date. Last year she forgot to come to the office for fasting lab work. Patient currently being followed monthly by her therapist who has recommended that she take Wellbutrin and Prozac daily for which she was requesting refill today. Her PCP is now doing her blood work.  Past medical history,surgical history, family history and social history were all reviewed and documented in the EPIC chart.  Gynecologic History Patient's last menstrual period was 07/16/2015. Contraception: post menopausal status Last Pap: 2014. Results were: normal Last mammogram: 2016. Results were: normal  Obstetric History OB History  Gravida Para Term Preterm AB SAB TAB Ectopic Multiple Living  2 2        2     # Outcome Date GA Lbr Len/2nd Weight Sex Delivery Anes PTL Lv  2 Para           1 Para                ROS: A ROS was performed and pertinent positives and negatives are included in the history.  GENERAL: No fevers or chills. HEENT: No change in vision, no earache, sore throat or sinus congestion. NECK: No pain or stiffness. CARDIOVASCULAR: No chest pain or pressure. No palpitations. PULMONARY: No shortness of breath, cough or wheeze.  GASTROINTESTINAL: No abdominal pain, nausea, vomiting or diarrhea, melena or bright red blood per rectum. GENITOURINARY: No urinary frequency, urgency, hesitancy or dysuria. MUSCULOSKELETAL: No joint or muscle pain, no back pain, no recent trauma. DERMATOLOGIC: No rash, no itching, no lesions. ENDOCRINE: No polyuria, polydipsia, no heat or cold intolerance. No recent change in weight. HEMATOLOGICAL: No anemia or easy bruising or bleeding. NEUROLOGIC: No headache, seizures, numbness, tingling or weakness. PSYCHIATRIC: No depression, no loss of interest in normal activity or change in sleep pattern.     Exam: chaperone present  BP 112/70 mmHg  Ht 4' 11.75" (1.518 m)  Wt 125 lb (56.7 kg)  BMI 24.61 kg/m2  LMP 07/16/2015  Body mass index is 24.61 kg/(m^2).  General appearance : Well developed well nourished female. No acute distress HEENT: Eyes: no retinal hemorrhage or exudates,  Neck supple, trachea midline, no carotid bruits, no thyroidmegaly Lungs: Clear to auscultation, no rhonchi or wheezes, or rib retractions  Heart: Regular rate and rhythm, no murmurs or gallops Breast:Examined in sitting and supine position were symmetrical in appearance, no palpable masses or tenderness,  no skin retraction, no nipple inversion, no nipple discharge, no skin discoloration, no axillary or supraclavicular lymphadenopathy Abdomen: no palpable masses or tenderness, no rebound or guarding Extremities: no edema or skin discoloration or tenderness  Pelvic:  Bartholin, Urethra, Skene Glands: Within normal limits  Vagina: No gross lesions or discharge  Cervix: No gross lesions or discharge  Uterus  anteverted, normal size, shape and consistency, non-tender and mobile  Adnexa  Without masses or tenderness  Anus and perineum  normal   Rectovaginal  normal sphincter tone without palpated masses or tenderness             Hemoccult colonoscopy this year     Assessment/Plan:  50 y.o. female for  annual exam will be referred to the gastroenterologist for her screening colonoscopy. Pap smear without HPV done today. Patient appears to be perimenopausal we'll going to check an [facility] level today. She has had a previous tubal ligation procedure. Literature information on the perimenopause as well as on hormone replacement therapy was provided. PCP is been doing her blood work.   Terrance Mass MD, 3:48 PM 09/07/2015

## 2015-09-07 NOTE — Patient Instructions (Signed)
Hormone Therapy At menopause, your body begins making less estrogen and progesterone hormones. This causes the body to stop having menstrual periods. This is because estrogen and progesterone hormones control your periods and menstrual cycle. A lack of estrogen may cause symptoms such as:  Hot flushes (or hot flashes).  Vaginal dryness.  Dry skin.  Loss of sex drive.  Risk of bone loss (osteoporosis). When this happens, you may choose to take hormone therapy to get back the estrogen lost during menopause. When the hormone estrogen is given alone, it is usually referred to as ET (Estrogen Therapy). When the hormone progestin is combined with estrogen, it is generally called HT (Hormone Therapy). This was formerly known as hormone replacement therapy (HRT). Your caregiver can help you make a decision on what will be best for you. The decision to use HT seems to change often as new studies are done. Many studies do not agree on the benefits of hormone replacement therapy. LIKELY BENEFITS OF HT INCLUDE PROTECTION FROM:  Hot Flushes (also called hot flashes) - A hot flush is a sudden feeling of heat that spreads over the face and body. The skin may redden like a blush. It is connected with sweats and sleep disturbance. Women going through menopause may have hot flushes a few times a month or several times per day depending on the woman.  Osteoporosis (bone loss) - Estrogen helps guard against bone loss. After menopause, a woman's bones slowly lose calcium and become weak and brittle. As a result, bones are more likely to break. The hip, wrist, and spine are affected most often. Hormone therapy can help slow bone loss after menopause. Weight bearing exercise and taking calcium with vitamin D also can help prevent bone loss. There are also medications that your caregiver can prescribe that can help prevent osteoporosis.  Vaginal dryness - Loss of estrogen causes changes in the vagina. Its lining may  become thin and dry. These changes can cause pain and bleeding during sexual intercourse. Dryness can also lead to infections. This can cause burning and itching. (Vaginal estrogen treatment can help relieve pain, itching, and dryness.)  Urinary tract infections are more common after menopause because of lack of estrogen. Some women also develop urinary incontinence because of low estrogen levels in the vagina and bladder.  Possible other benefits of estrogen include a positive effect on mood and short-term memory in women. RISKS AND COMPLICATIONS  Using estrogen alone without progesterone causes the lining of the uterus to grow. This increases the risk of lining of the uterus (endometrial) cancer. Your caregiver should give another hormone called progestin if you have a uterus.  Women who take combined (estrogen and progestin) HT appear to have an increased risk of breast cancer. The risk appears to be small, but increases throughout the time that HT is taken.  Combined therapy also makes the breast tissue slightly denser which makes it harder to read mammograms (breast X-rays).  Combined, estrogen and progesterone therapy can be taken together every day, in which case there may be spotting of blood. HT therapy can be taken cyclically in which case you will have menstrual periods. Cyclically means HT is taken for a set amount of days, then not taken, then this process is repeated.  HT may increase the risk of stroke, heart attack, breast cancer and forming blood clots in your leg.  Transdermal estrogen (estrogen that is absorbed through the skin with a patch or a cream) may have better results with:  Cholesterol.  Blood pressure.  Blood clots. Having the following conditions may indicate you should not have HT:  Endometrial cancer.  Liver disease.  Breast cancer.  Heart disease.  History of blood clots.  Stroke. TREATMENT   If you choose to take HT and have a uterus, usually  estrogen and progestin are prescribed.  Your caregiver will help you decide the best way to take the medications.  Possible ways to take estrogen include:  Pills.  Patches.  Gels.  Sprays.  Vaginal estrogen cream, rings and tablets.  It is best to take the lowest dose possible that will help your symptoms and take them for the shortest period of time that you can.  Hormone therapy can help relieve some of the problems (symptoms) that affect women at menopause. Before making a decision about HT, talk to your caregiver about what is best for you. Be well informed and comfortable with your decisions. HOME CARE INSTRUCTIONS   Follow your caregivers advice when taking the medications.  A Pap test is done to screen for cervical cancer.  The first Pap test should be done at age 34.  Between ages 80 and 52, Pap tests are repeated every 2 years.  Beginning at age 13, you are advised to have a Pap test every 3 years as long as the past 3 Pap tests have been normal.  Some women have medical problems that increase the chance of getting cervical cancer. Talk to your caregiver about these problems. It is especially important to talk to your caregiver if a new problem develops soon after your last Pap test. In these cases, your caregiver may recommend more frequent screening and Pap tests.  The above recommendations are the same for women who have or have not gotten the vaccine for HPV (human papillomavirus).  If you had a hysterectomy for a problem that was not a cancer or a condition that could lead to cancer, then you no longer need Pap tests. However, even if you no longer need a Pap test, a regular exam is a good idea to make sure no other problems are starting.  If you are between ages 20 and 60, and you have had normal Pap tests going back 10 years, you no longer need Pap tests. However, even if you no longer need a Pap test, a regular exam is a good idea to make sure no other problems  are starting.  If you have had past treatment for cervical cancer or a condition that could lead to cancer, you need Pap tests and screening for cancer for at least 20 years after your treatment.  If Pap tests have been discontinued, risk factors (such as a new sexual partner)need to be re-assessed to determine if screening should be resumed.  Some women may need screenings more often if they are at high risk for cervical cancer.  Get mammograms done as per the advice of your caregiver. SEEK IMMEDIATE MEDICAL CARE IF:  You develop abnormal vaginal bleeding.  You have pain or swelling in your legs, shortness of breath, or chest pain.  You develop dizziness or headaches.  You have lumps or changes in your breasts or armpits.  You have slurred speech.  You develop weakness or numbness of your arms or legs.  You have pain, burning, or bleeding when urinating.  You develop abdominal pain.   This information is not intended to replace advice given to you by your health care provider. Make sure you discuss any questions  you have with your health care provider.   Document Released: 01/06/2003 Document Revised: 08/24/2014 Document Reviewed: 10/11/2014 Elsevier Interactive Patient Education 2016 [place] is the time when your body begins to move into the menopause (no menstrual period for 12 straight months). It is a natural process. Perimenopause can begin 2-8 years before the menopause and usually lasts for 1 year after the menopause. During this time, your ovaries may or may not produce an egg. The ovaries vary in their production of estrogen and progesterone hormones each month. This can cause irregular menstrual periods, difficulty getting pregnant, vaginal bleeding between periods, and uncomfortable symptoms. CAUSES  Irregular production of the ovarian hormones, estrogen and progesterone, and not ovulating every month.  Other causes  include:  Tumor of the pituitary gland in the brain.  Medical disease that affects the ovaries.  Radiation treatment.  Chemotherapy.  Unknown causes.  Heavy smoking and excessive alcohol intake can bring on perimenopause sooner. SIGNS AND SYMPTOMS   Hot flashes.  Night sweats.  Irregular menstrual periods.  Decreased sex drive.  Vaginal dryness.  Headaches.  Mood swings.  Depression.  Memory problems.  Irritability.  Tiredness.  Weight gain.  Trouble getting pregnant.  The beginning of losing bone cells (osteoporosis).  The beginning of hardening of the arteries (atherosclerosis). DIAGNOSIS  Your health care provider will make a diagnosis by analyzing your age, menstrual history, and symptoms. He or she will do a physical exam and note any changes in your body, especially your female organs. Female hormone tests may or may not be helpful depending on the amount of female hormones you produce and when you produce them. However, other hormone tests may be helpful to rule out other problems. TREATMENT  In some cases, no treatment is needed. The decision on whether treatment is necessary during the perimenopause should be made by you and your health care provider based on how the symptoms are affecting you and your lifestyle. Various treatments are available, such as:  Treating individual symptoms with a specific medicine for that symptom.  Herbal medicines that can help specific symptoms.  Counseling.  Group therapy. HOME CARE INSTRUCTIONS   Keep track of your menstrual periods (when they occur, how heavy they are, how long between periods, and how long they last) as well as your symptoms and when they started.  Only take over-the-counter or prescription medicines as directed by your health care provider.  Sleep and rest.  Exercise.  Eat a diet that contains calcium (good for your bones) and soy (acts like the estrogen hormone).  Do not smoke.  Avoid  alcoholic beverages.  Take vitamin supplements as recommended by your health care provider. Taking vitamin E may help in certain cases.  Take calcium and vitamin D supplements to help prevent bone loss.  Group therapy is sometimes helpful.  Acupuncture may help in some cases. SEEK MEDICAL CARE IF:   You have questions about any symptoms you are having.  You need a referral to a specialist (gynecologist, psychiatrist, or psychologist). SEEK IMMEDIATE MEDICAL CARE IF:   You have vaginal bleeding.  Your period lasts longer than 8 days.  Your periods are recurring sooner than 21 days.  You have bleeding after intercourse.  You have severe depression.  You have pain when you urinate.  You have severe headaches.  You have vision problems.   This information is not intended to replace advice given to you by your health care provider. Make sure you discuss any  questions you have with your health care provider.   Document Released: 05/17/2004 Document Revised: 04/30/2014 Document Reviewed: 11/06/2012 Elsevier Interactive Patient Education [employer].

## 2015-09-08 LAB — PAP IG W/ RFLX HPV ASCU

## 2015-09-08 LAB — FOLLICLE STIMULATING HORMONE: FSH: 13.6 m[IU]/mL

## 2016-01-10 DIAGNOSIS — Z0000 Encounter for general adult medical examination without abnormal findings: Secondary | ICD-10-CM | POA: Diagnosis not present

## 2016-01-10 DIAGNOSIS — E559 Vitamin D deficiency, unspecified: Secondary | ICD-10-CM | POA: Diagnosis not present

## 2016-01-17 DIAGNOSIS — K5730 Diverticulosis of large intestine without perforation or abscess without bleeding: Secondary | ICD-10-CM | POA: Diagnosis not present

## 2016-01-17 DIAGNOSIS — Z1389 Encounter for screening for other disorder: Secondary | ICD-10-CM | POA: Diagnosis not present

## 2016-01-17 DIAGNOSIS — E784 Other hyperlipidemia: Secondary | ICD-10-CM | POA: Diagnosis not present

## 2016-01-17 DIAGNOSIS — Z6824 Body mass index (BMI) 24.0-24.9, adult: Secondary | ICD-10-CM | POA: Diagnosis not present

## 2016-01-17 DIAGNOSIS — Z23 Encounter for immunization: Secondary | ICD-10-CM | POA: Diagnosis not present

## 2016-01-17 DIAGNOSIS — Z0000 Encounter for general adult medical examination without abnormal findings: Secondary | ICD-10-CM | POA: Diagnosis not present

## 2016-01-17 DIAGNOSIS — K219 Gastro-esophageal reflux disease without esophagitis: Secondary | ICD-10-CM | POA: Diagnosis not present

## 2016-01-17 DIAGNOSIS — E559 Vitamin D deficiency, unspecified: Secondary | ICD-10-CM | POA: Diagnosis not present

## 2016-01-25 DIAGNOSIS — Z1212 Encounter for screening for malignant neoplasm of rectum: Secondary | ICD-10-CM | POA: Diagnosis not present

## 2016-02-14 ENCOUNTER — Encounter: Payer: Self-pay | Admitting: Internal Medicine

## 2016-02-20 ENCOUNTER — Encounter: Payer: Self-pay | Admitting: Internal Medicine

## 2016-03-02 DIAGNOSIS — L814 Other melanin hyperpigmentation: Secondary | ICD-10-CM | POA: Diagnosis not present

## 2016-03-02 DIAGNOSIS — L282 Other prurigo: Secondary | ICD-10-CM | POA: Diagnosis not present

## 2016-03-02 DIAGNOSIS — L110 Acquired keratosis follicularis: Secondary | ICD-10-CM | POA: Diagnosis not present

## 2016-03-02 DIAGNOSIS — D485 Neoplasm of uncertain behavior of skin: Secondary | ICD-10-CM | POA: Diagnosis not present

## 2016-03-02 DIAGNOSIS — L300 Nummular dermatitis: Secondary | ICD-10-CM | POA: Diagnosis not present

## 2016-04-18 ENCOUNTER — Ambulatory Visit (AMBULATORY_SURGERY_CENTER): Payer: Self-pay | Admitting: *Deleted

## 2016-04-18 VITALS — Ht 60.0 in | Wt 126.0 lb

## 2016-04-18 DIAGNOSIS — Z1211 Encounter for screening for malignant neoplasm of colon: Secondary | ICD-10-CM

## 2016-04-18 NOTE — Progress Notes (Signed)
Patient denies any allergies to eggs or soy. Patient denies any problems with anesthesia/sedation. Patient denies any oxygen use at home and does not take any diet/weight loss medications. Patient declined EMMI education. 

## 2016-04-18 NOTE — Progress Notes (Signed)
Patient did state she has post-op nausea and vomiting, "uses patch" per pt.

## 2016-05-04 ENCOUNTER — Encounter: Payer: BLUE CROSS/BLUE SHIELD | Admitting: Internal Medicine

## 2016-05-11 DIAGNOSIS — H52201 Unspecified astigmatism, right eye: Secondary | ICD-10-CM | POA: Diagnosis not present

## 2016-05-11 DIAGNOSIS — H53001 Unspecified amblyopia, right eye: Secondary | ICD-10-CM | POA: Diagnosis not present

## 2016-05-17 ENCOUNTER — Encounter: Payer: Self-pay | Admitting: Internal Medicine

## 2016-05-17 ENCOUNTER — Ambulatory Visit (AMBULATORY_SURGERY_CENTER): Payer: BLUE CROSS/BLUE SHIELD | Admitting: Internal Medicine

## 2016-05-17 VITALS — BP 131/77 | HR 68 | Temp 97.1°F | Resp 9 | Ht 60.0 in | Wt 126.0 lb

## 2016-05-17 DIAGNOSIS — Z1211 Encounter for screening for malignant neoplasm of colon: Secondary | ICD-10-CM

## 2016-05-17 DIAGNOSIS — Z1212 Encounter for screening for malignant neoplasm of rectum: Secondary | ICD-10-CM

## 2016-05-17 DIAGNOSIS — D123 Benign neoplasm of transverse colon: Secondary | ICD-10-CM | POA: Diagnosis not present

## 2016-05-17 NOTE — Progress Notes (Signed)
Report to PACU, RN, vss, BBS= Clear.  

## 2016-05-17 NOTE — Patient Instructions (Addendum)
I found and removed 2 tiny polyps. I will let you know pathology results and when to have another routine colonoscopy by mail.  You also have a condition called diverticulosis - common and not usually a problem. Please read the handout provided.  You do not need to undergo routine testing of the stool for blood annually since you have had a colonoscopy.  I appreciate the opportunity to care for you. Gatha Mayer, MD, [facility]   Discharge instructions given. Handouts on polyps and diverticulosis. Resume previous medications. YOU HAD AN ENDOSCOPIC PROCEDURE TODAY AT [facility]:   Refer to the procedure report that was given to you for any specific questions about what was found during the examination.  If the procedure report does not answer your questions, please call your gastroenterologist to clarify.  If you requested that your care partner not be given the details of your procedure findings, then the procedure report has been included in a sealed envelope for you to review at your convenience later.  YOU SHOULD EXPECT: Some feelings of bloating in the abdomen. Passage of more gas than usual.  Walking can help get rid of the air that was put into your GI tract during the procedure and reduce the bloating. If you had a lower endoscopy (such as a colonoscopy or flexible sigmoidoscopy) you may notice spotting of blood in your stool or on the toilet paper. If you underwent a bowel prep for your procedure, you may not have a normal bowel movement for a few days.  Please Note:  You might notice some irritation and congestion in your nose or some drainage.  This is from the oxygen used during your procedure.  There is no need for concern and it should clear up in a day or so.  SYMPTOMS TO REPORT IMMEDIATELY:    Following upper endoscopy (EGD)  Vomiting of blood or coffee ground material  New chest pain or pain under the shoulder blades  Painful or persistently  difficult swallowing  New shortness of breath  Fever of 100F or higher  Black, tarry-looking stools  For urgent or emergent issues, a gastroenterologist can be reached at any hour by calling [phone].   DIET:  We do recommend a small meal at first, but then you may proceed to your regular diet.  Drink plenty of fluids but you should avoid alcoholic beverages for 24 hours.  ACTIVITY:  You should plan to take it easy for the rest of today and you should NOT DRIVE or use heavy machinery until tomorrow (because of the sedation medicines used during the test).    FOLLOW UP: Our staff will call the number listed on your records the next business day following your procedure to check on you and address any questions or concerns that you may have regarding the information given to you following your procedure. If we do not reach you, we will leave a message.  However, if you are feeling well and you are not experiencing any problems, there is no need to return our call.  We will assume that you have returned to your regular daily activities without incident.  If any biopsies were taken you will be contacted by phone or by letter within the next 1-3 weeks.  Please call us at [phone] if you have not heard about the biopsies in 3 weeks.    SIGNATURES/CONFIDENTIALITY: You and/or your care partner have signed paperwork which will be entered into your electronic  medical record.  These signatures attest to the fact that that the information above on your After Visit Summary has been reviewed and is understood.  Full responsibility of the confidentiality of this discharge information lies with you and/or your care-partner.

## 2016-05-17 NOTE — Op Note (Signed)
[place] Patient Name: [name] Procedure Date: 05/17/2016 1:19 PM MRN: [id number] Endoscopist: Gatha Mayer , MD Age: 51 Referring MD:  Date of Birth: 11-Sep-1965 Gender: Female Account #: [id number] Procedure:                Colonoscopy Indications:              Screening for colorectal malignant neoplasm, This                            is the patient's first colonoscopy Medicines:                Propofol per Anesthesia, Monitored Anesthesia Care Procedure:                Pre-Anesthesia Assessment:                           - Prior to the procedure, a History and Physical                            was performed, and patient medications and                            allergies were reviewed. The patient's tolerance of                            previous anesthesia was also reviewed. The risks                            and benefits of the procedure and the sedation                            options and risks were discussed with the patient.                            All questions were answered, and informed consent                            was obtained. Prior Anticoagulants: The patient has                            taken no previous anticoagulant or antiplatelet                            agents. ASA Grade Assessment: II - A patient with                            mild systemic disease. After reviewing the risks                            and benefits, the patient was deemed in                            satisfactory condition to undergo the procedure.  After obtaining informed consent, the colonoscope                            was passed under direct vision. Throughout the                            procedure, the patient's blood pressure, pulse, and                            oxygen saturations were monitored continuously. The                            Model CF-HQ190L [id number]) scope was introduced   through the anus and advanced to the the cecum,                            identified by appendiceal orifice and ileocecal                            valve. The colonoscopy was performed without                            difficulty. The patient tolerated the procedure                            well. The quality of the bowel preparation was                            excellent. The ileocecal valve, appendiceal                            orifice, and rectum were photographed. Scope In: 1:24:51 PM Scope Out: 1:47:21 PM Scope Withdrawal Time: 0 hours 19 minutes 3 seconds  Total Procedure Duration: 0 hours 22 minutes 30 seconds  Findings:                 The perianal and digital rectal examinations were                            normal.                           Two sessile polyps were found in the transverse                            colon. The polyps were diminutive in size. These                            polyps were removed with a cold biopsy forceps.                            Resection and retrieval were complete. Verification                            of patient identification for the specimen was  done. Estimated blood loss was minimal.                           Multiple small and large-mouthed diverticula were                            found in the entire colon.                           The exam was otherwise without abnormality on                            direct and retroflexion views. Complications:            No immediate complications. Estimated Blood Loss:     Estimated blood loss was minimal. Impression:               - Two diminutive polyps in the transverse colon,                            removed with a cold biopsy forceps. Resected and                            retrieved.                           - Diverticulosis in the entire examined colon.                           - The examination was otherwise normal on direct                             and retroflexion views. Recommendation:           - Patient has a contact number available for                            emergencies. The signs and symptoms of potential                            delayed complications were discussed with the                            patient. Return to normal activities tomorrow.                            Written discharge instructions were provided to the                            patient.                           - Resume previous diet.                           - Continue present medications.                           -  Repeat colonoscopy is recommended. The                            colonoscopy date will be determined after pathology                            results from today's exam become available for                            review.                           - Does not need annual hemoccults since she had a                            colonoscopy for screening. Gatha Mayer, MD 05/17/2016 1:56:03 PM This report has been signed electronically.

## 2016-05-17 NOTE — Progress Notes (Signed)
Called to room to assist during endoscopic procedure.  Patient ID and intended procedure confirmed with present staff. Received instructions for my participation in the procedure from the performing physician.  

## 2016-05-18 ENCOUNTER — Telehealth: Payer: Self-pay

## 2016-05-18 NOTE — Telephone Encounter (Signed)
  Follow up Call-  Call back number 05/17/2016  Post procedure Call Back phone  # [phone]  Permission to leave phone message Yes  Some recent data might be hidden     Patient questions:  Do you have a fever, pain , or abdominal swelling? No. Pain Score  0 *  Have you tolerated food without any problems? Yes.    Have you been able to return to your normal activities? Yes.    Do you have any questions about your discharge instructions: Diet   No. Medications  No. Follow up visit  No.  Do you have questions or concerns about your Care? No.  Actions: * If pain score is 4 or above: No action needed, pain <4.

## 2016-05-22 ENCOUNTER — Encounter: Payer: Self-pay | Admitting: Internal Medicine

## 2016-05-22 DIAGNOSIS — Z860101 Personal history of adenomatous and serrated colon polyps: Secondary | ICD-10-CM

## 2016-05-22 DIAGNOSIS — Z86010 Personal history of colonic polyps: Secondary | ICD-10-CM

## 2016-05-22 HISTORY — DX: Personal history of colonic polyps: Z86.010

## 2016-05-22 HISTORY — DX: Personal history of adenomatous and serrated colon polyps: Z86.0101

## 2016-05-22 NOTE — Progress Notes (Signed)
2 diminutive adenomas repeat colonoscopy 2023 Letter sent by My Chart

## 2016-06-15 DIAGNOSIS — H906 Mixed conductive and sensorineural hearing loss, bilateral: Secondary | ICD-10-CM | POA: Diagnosis not present

## 2016-06-20 ENCOUNTER — Other Ambulatory Visit: Payer: Self-pay | Admitting: Internal Medicine

## 2016-06-20 DIAGNOSIS — Z1231 Encounter for screening mammogram for malignant neoplasm of breast: Secondary | ICD-10-CM

## 2016-07-06 ENCOUNTER — Ambulatory Visit
Admission: RE | Admit: 2016-07-06 | Discharge: 2016-07-06 | Disposition: A | Discharge status: Home / Self Care | Payer: BLUE CROSS/BLUE SHIELD | Source: Ambulatory Visit | Attending: Internal Medicine | Admitting: Internal Medicine

## 2016-07-06 DIAGNOSIS — Z1231 Encounter for screening mammogram for malignant neoplasm of breast: Secondary | ICD-10-CM

## 2016-09-05 ENCOUNTER — Encounter: Payer: Self-pay | Admitting: Gynecology

## 2016-09-07 ENCOUNTER — Encounter: Payer: Self-pay | Admitting: Gynecology

## 2016-09-07 ENCOUNTER — Ambulatory Visit (INDEPENDENT_AMBULATORY_CARE_PROVIDER_SITE_OTHER): Payer: BLUE CROSS/BLUE SHIELD | Admitting: Gynecology

## 2016-09-07 VITALS — BP 122/80 | Ht 60.0 in | Wt 129.0 lb

## 2016-09-07 DIAGNOSIS — E559 Vitamin D deficiency, unspecified: Secondary | ICD-10-CM

## 2016-09-07 DIAGNOSIS — N951 Menopausal and female climacteric states: Secondary | ICD-10-CM

## 2016-09-07 DIAGNOSIS — Z01419 Encounter for gynecological examination (general) (routine) without abnormal findings: Secondary | ICD-10-CM | POA: Diagnosis not present

## 2016-09-07 NOTE — Patient Instructions (Signed)
Perimenopause Perimenopause is the time when your body begins to move into the menopause (no menstrual period for 12 straight months). It is a natural process. Perimenopause can begin 2-8 years before the menopause and usually lasts for 1 year after the menopause. During this time, your ovaries may or may not produce an egg. The ovaries vary in their production of estrogen and progesterone hormones each month. This can cause irregular menstrual periods, difficulty getting pregnant, vaginal bleeding between periods, and uncomfortable symptoms. What are the causes?  Irregular production of the ovarian hormones, estrogen and progesterone, and not ovulating every month. Other causes include:  Tumor of the pituitary gland in the brain.  Medical disease that affects the ovaries.  Radiation treatment.  Chemotherapy.  Unknown causes.  Heavy smoking and excessive alcohol intake can bring on perimenopause sooner. What are the signs or symptoms?  Hot flashes.  Night sweats.  Irregular menstrual periods.  Decreased sex drive.  Vaginal dryness.  Headaches.  Mood swings.  Depression.  Memory problems.  Irritability.  Tiredness.  Weight gain.  Trouble getting pregnant.  The beginning of losing bone cells (osteoporosis).  The beginning of hardening of the arteries (atherosclerosis). How is this diagnosed? Your health care provider will make a diagnosis by analyzing your age, menstrual history, and symptoms. He or she will do a physical exam and note any changes in your body, especially your female organs. Female hormone tests may or may not be helpful depending on the amount of female hormones you produce and when you produce them. However, other hormone tests may be helpful to rule out other problems. How is this treated? In some cases, no treatment is needed. The decision on whether treatment is necessary during the perimenopause should be made by you and your health care  provider based on how the symptoms are affecting you and your lifestyle. Various treatments are available, such as:  Treating individual symptoms with a specific medicine for that symptom.  Herbal medicines that can help specific symptoms.  Counseling.  Group therapy. Follow these instructions at home:  Keep track of your menstrual periods (when they occur, how heavy they are, how long between periods, and how long they last) as well as your symptoms and when they started.  Only take over-the-counter or prescription medicines as directed by your health care provider.  Sleep and rest.  Exercise.  Eat a diet that contains calcium (good for your bones) and soy (acts like the estrogen hormone).  Do not smoke.  Avoid alcoholic beverages.  Take vitamin supplements as recommended by your health care provider. Taking vitamin E may help in certain cases.  Take calcium and vitamin D supplements to help prevent bone loss.  Group therapy is sometimes helpful.  Acupuncture may help in some cases. Contact a health care provider if:  You have questions about any symptoms you are having.  You need a referral to a specialist (gynecologist, psychiatrist, or psychologist). Get help right away if:  You have vaginal bleeding.  Your period lasts longer than 8 days.  Your periods are recurring sooner than 21 days.  You have bleeding after intercourse.  You have severe depression.  You have pain when you urinate.  You have severe headaches.  You have vision problems. This information is not intended to replace advice given to you by your health care provider. Make sure you discuss any questions you have with your health care provider. Document Released: 05/17/2004 Document Revised: 09/15/2015 Document Reviewed: 11/06/2012 Elsevier Interactive   Patient Education  2017 [employer].  

## 2016-09-07 NOTE — Addendum Note (Signed)
Addended by: Burnett Kanaris on: 09/07/2016 12:05 PM   Modules accepted: Orders

## 2016-09-07 NOTE — Progress Notes (Signed)
[name] 12/25/1965 [id number]   History:    51 y.o.  for annual gyn exam with the only complaining of her cycles are getting further and further apart may be 3 any urine most and very minimal if any hot flashes.Review of patient's records indicated that she has had history of resectoscopic polypectomy along with endometrial ablation. Patient is also had prior history of tubal sterilization procedure. 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 Patient also has had history in the past vitamin D deficiency as well as anemia. Patient denies any past history of any abnormal Pap smear. Her vaccines are up-to-date. Patient has not seen a therapist for her depression she had been on Wellbutrin and Prozac that would like to discontinue the Prozac since she no longer suffers from premenstrual syndrome. She will return back to the office in a fasting state for blood work since she has not seen her PCP. She did have a colonoscopy in January this year which benign polyps were removed and she is on a 5 year recall.   Past medical history,surgical history, family history and social history were all reviewed and documented in the EPIC chart.  Gynecologic History No LMP recorded. Patient is not currently having periods (Reason: Perimenopausal). Contraception: menopausal   Last Pap:  2017. Results were: normal Last mammogram:  2018. Results were: normal  Obstetric History OB History  Gravida Para Term Preterm AB Living  2 2       2   SAB TAB Ectopic Multiple Live Births               # Outcome Date GA Lbr Len/2nd Weight Sex Delivery Anes PTL Lv  2 Para           1 Para                ROS: A ROS was performed and pertinent positives and negatives are included in the history.  GENERAL: No fevers or chills. HEENT: No change in vision, no earache, sore throat or sinus congestion. NECK: No  pain or stiffness. CARDIOVASCULAR: No chest pain or pressure. No palpitations. PULMONARY: No shortness of breath, cough or wheeze. GASTROINTESTINAL: No abdominal pain, nausea, vomiting or diarrhea, melena or bright red blood per rectum. GENITOURINARY: No urinary frequency, urgency, hesitancy or dysuria. MUSCULOSKELETAL: No joint or muscle pain, no back pain, no recent trauma. DERMATOLOGIC: No rash, no itching, no lesions. ENDOCRINE: No polyuria, polydipsia, no heat or cold intolerance. No recent change in weight. HEMATOLOGICAL: No anemia or easy bruising or bleeding. NEUROLOGIC: No headache, seizures, numbness, tingling or weakness. PSYCHIATRIC: No depression, no loss of interest in normal activity or change in sleep pattern.     Exam: chaperone present  BP 122/80   Ht 5' (1.524 m)   Wt 129 lb (58.5 kg)   BMI 25.19 kg/m   Body mass index is 25.19 kg/m.  General appearance : Well developed well nourished female. No acute distress HEENT: Eyes: no retinal hemorrhage or exudates,  Neck supple, trachea midline, no carotid bruits, no thyroidmegaly Lungs: Clear to auscultation, no rhonchi or wheezes, or rib retractions  Heart: Regular rate and rhythm, no murmurs or gallops Breast:Examined in sitting and supine position were symmetrical in appearance, no palpable masses or tenderness,  no skin retraction, no nipple inversion, no nipple discharge, no skin discoloration, no axillary or supraclavicular  lymphadenopathy Abdomen: no palpable masses or tenderness, no rebound or guarding Extremities: no edema or skin discoloration or tenderness  Pelvic:  Bartholin, Urethra, Skene Glands: Within normal limits             Vagina: No gross lesions or discharge  Cervix: No gross lesions or discharge  Uterus  anteverted, normal size, shape and consistency, non-tender and mobile  Adnexa  Without masses or tenderness  Anus and perineum  normal   Rectovaginal  normal sphincter tone without palpated masses or  tenderness             Hemoccult  colonoscopy less than 12 months ago benign polyps removed     Assessment/Plan:  51 y.o. female for annual exam  who is perimenopausal with minimal hot flashes. We'll check an [facility] today. Literature information on the perimenopause was presented for her to read. She will return to the office next week in a fasting state to check her screening blood work consisting of comprehensive metabolic panel, fasting lipid profile, and CBC. Pap smear not indicated this year. We discussed importance of calcium vitamin D and weightbearing exercises for osteoporosis prevention.   Terrance Mass MD, 11:38 AM 09/07/2016

## 2016-09-10 ENCOUNTER — Telehealth: Payer: Self-pay | Admitting: *Deleted

## 2016-09-10 LAB — PAP IG W/ RFLX HPV ASCU

## 2016-09-10 NOTE — Telephone Encounter (Signed)
Pt called stating you are trying to wean her off Prozac 10 mg, told pt to cut in half, medication is capsule form and this can't be done. Pt asked how should she proceed? Please advise

## 2016-09-10 NOTE — Telephone Encounter (Signed)
She can take 1 tablet every other day for 2 weeks. The following 2 weeks she can take 1 tablet Monday Wednesday Friday the last 2 weeks take 1 tablet on Monday and on Friday

## 2016-09-10 NOTE — Telephone Encounter (Signed)
Pt informed with the below note. 

## 2016-09-21 ENCOUNTER — Other Ambulatory Visit: Payer: Self-pay | Admitting: Gynecology

## 2016-12-03 ENCOUNTER — Telehealth: Payer: Self-pay | Admitting: *Deleted

## 2016-12-03 NOTE — Telephone Encounter (Signed)
Pt weaned off Prozac 10 mg called back to inform she will be starting back on Rx was not feeling/doing well without medication.

## 2017-01-11 DIAGNOSIS — Z0000 Encounter for general adult medical examination without abnormal findings: Secondary | ICD-10-CM | POA: Diagnosis not present

## 2017-01-11 DIAGNOSIS — E559 Vitamin D deficiency, unspecified: Secondary | ICD-10-CM | POA: Diagnosis not present

## 2017-01-11 DIAGNOSIS — E784 Other hyperlipidemia: Secondary | ICD-10-CM | POA: Diagnosis not present

## 2017-01-18 DIAGNOSIS — Z23 Encounter for immunization: Secondary | ICD-10-CM | POA: Diagnosis not present

## 2017-01-18 DIAGNOSIS — G5701 Lesion of sciatic nerve, right lower limb: Secondary | ICD-10-CM | POA: Diagnosis not present

## 2017-01-18 DIAGNOSIS — Z0000 Encounter for general adult medical examination without abnormal findings: Secondary | ICD-10-CM | POA: Diagnosis not present

## 2017-01-18 DIAGNOSIS — Z1389 Encounter for screening for other disorder: Secondary | ICD-10-CM | POA: Diagnosis not present

## 2017-01-18 DIAGNOSIS — E784 Other hyperlipidemia: Secondary | ICD-10-CM | POA: Diagnosis not present

## 2017-01-18 DIAGNOSIS — K5730 Diverticulosis of large intestine without perforation or abscess without bleeding: Secondary | ICD-10-CM | POA: Diagnosis not present

## 2017-01-18 DIAGNOSIS — K219 Gastro-esophageal reflux disease without esophagitis: Secondary | ICD-10-CM | POA: Diagnosis not present

## 2017-01-21 ENCOUNTER — Telehealth: Payer: Self-pay | Admitting: *Deleted

## 2017-01-21 ENCOUNTER — Other Ambulatory Visit: Payer: Self-pay

## 2017-01-21 MED ORDER — FLUOXETINE HCL 10 MG PO CAPS: ORAL_CAPSULE | ORAL | 7 refills | Status: DC

## 2017-01-21 NOTE — Telephone Encounter (Signed)
Patient called requesting a new Rx for Prozac 10 mg, tired to wean off and doesn't feel good off medication. Annual exam was in May. Last filled on 09/21/16. Please advise

## 2017-01-21 NOTE — Telephone Encounter (Signed)
Rx sent 

## 2017-01-21 NOTE — Telephone Encounter (Signed)
Okay to refill Prozac 10 mg daily through next annual exam

## 2017-03-27 ENCOUNTER — Other Ambulatory Visit: Payer: Self-pay

## 2017-03-29 ENCOUNTER — Other Ambulatory Visit: Payer: Self-pay

## 2017-06-13 DIAGNOSIS — H53001 Unspecified amblyopia, right eye: Secondary | ICD-10-CM | POA: Diagnosis not present

## 2017-07-30 ENCOUNTER — Other Ambulatory Visit: Payer: Self-pay | Admitting: Internal Medicine

## 2017-07-30 DIAGNOSIS — Z1231 Encounter for screening mammogram for malignant neoplasm of breast: Secondary | ICD-10-CM

## 2017-08-22 ENCOUNTER — Ambulatory Visit
Admission: RE | Admit: 2017-08-22 | Discharge: 2017-08-22 | Disposition: A | Discharge status: Home / Self Care | Payer: BLUE CROSS/BLUE SHIELD | Source: Ambulatory Visit | Attending: Internal Medicine | Admitting: Internal Medicine

## 2017-08-22 DIAGNOSIS — Z1231 Encounter for screening mammogram for malignant neoplasm of breast: Secondary | ICD-10-CM | POA: Diagnosis not present

## 2017-09-23 IMAGING — MG 2D DIGITAL SCREENING BILATERAL MAMMOGRAM WITH CAD AND ADJUNCT TO
9 of 12 series · 9 of 28 positions shown · non-contrast
Comparison: Previous exam(s).

CLINICAL DATA: Screening.

EXAM:
2D DIGITAL SCREENING BILATERAL MAMMOGRAM WITH CAD AND ADJUNCT TOMO

[R CC]
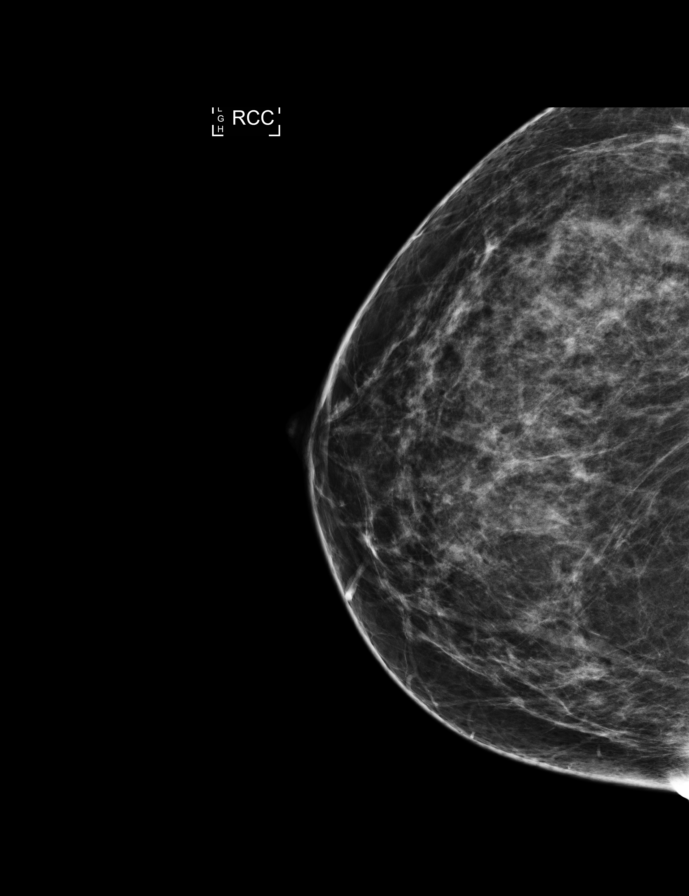

[L CC synth-2D]
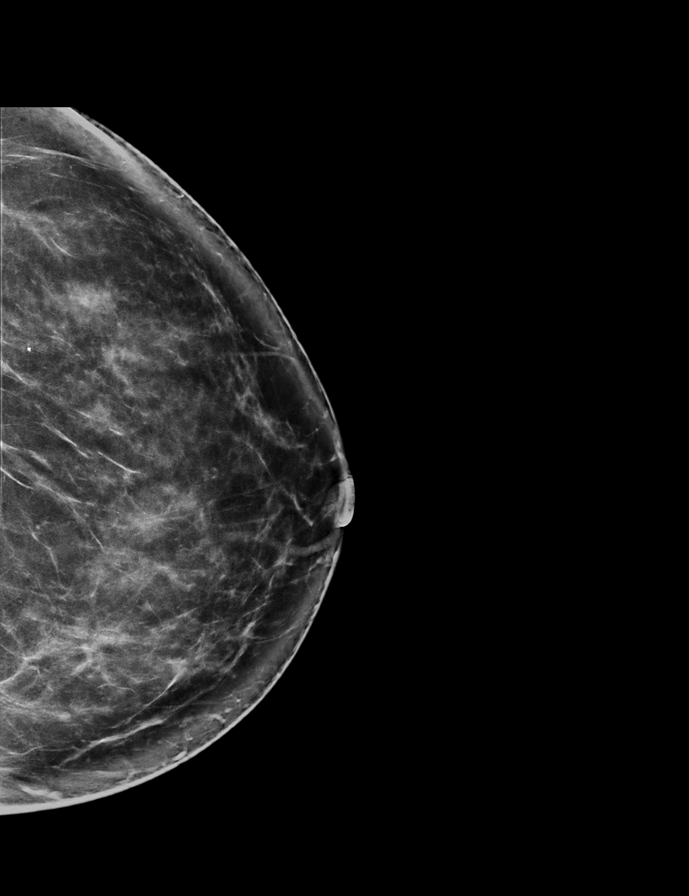

[L MLO synth-2D]
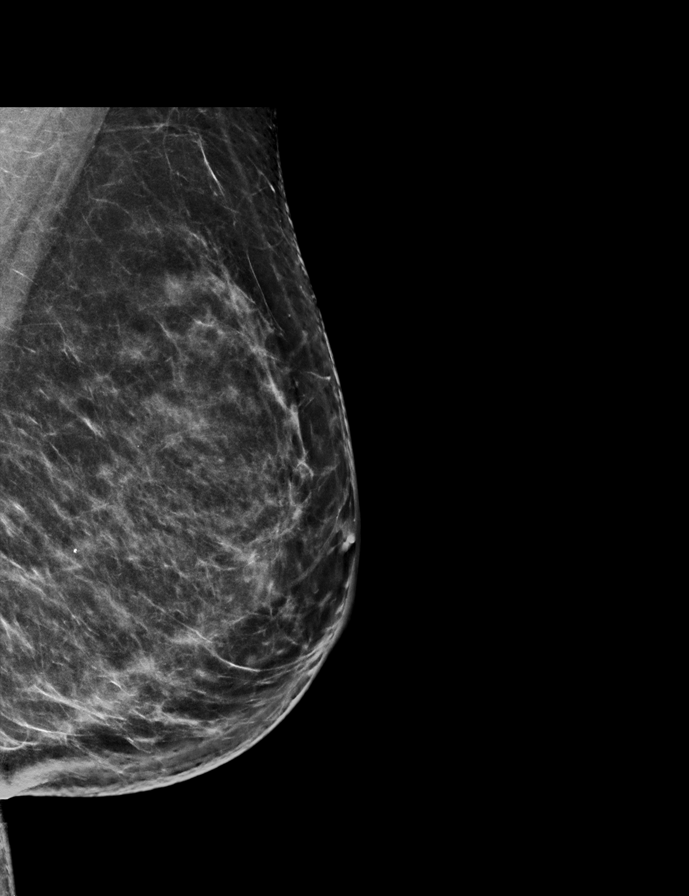

[R MLO]
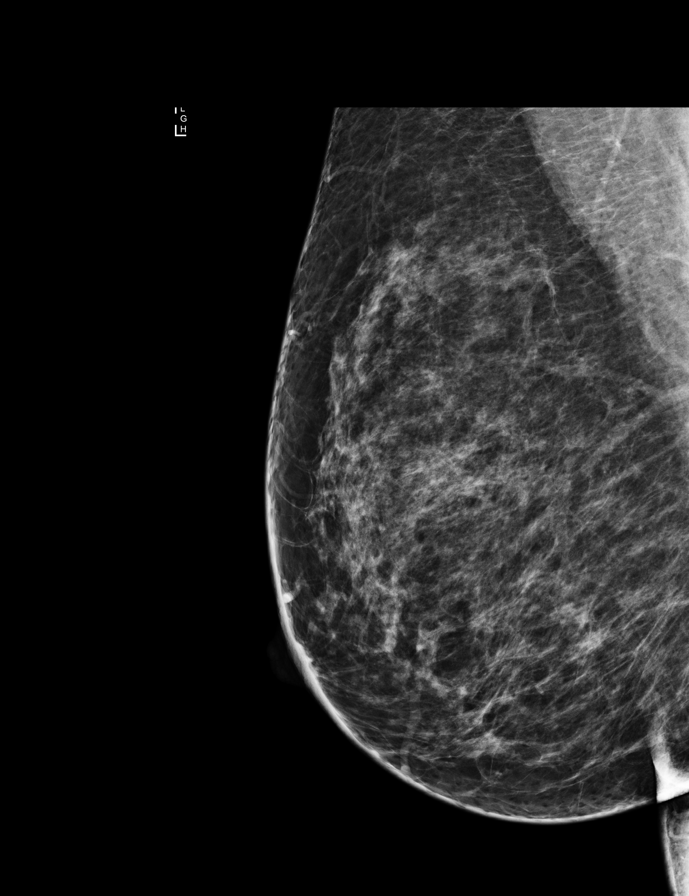

[L CC]
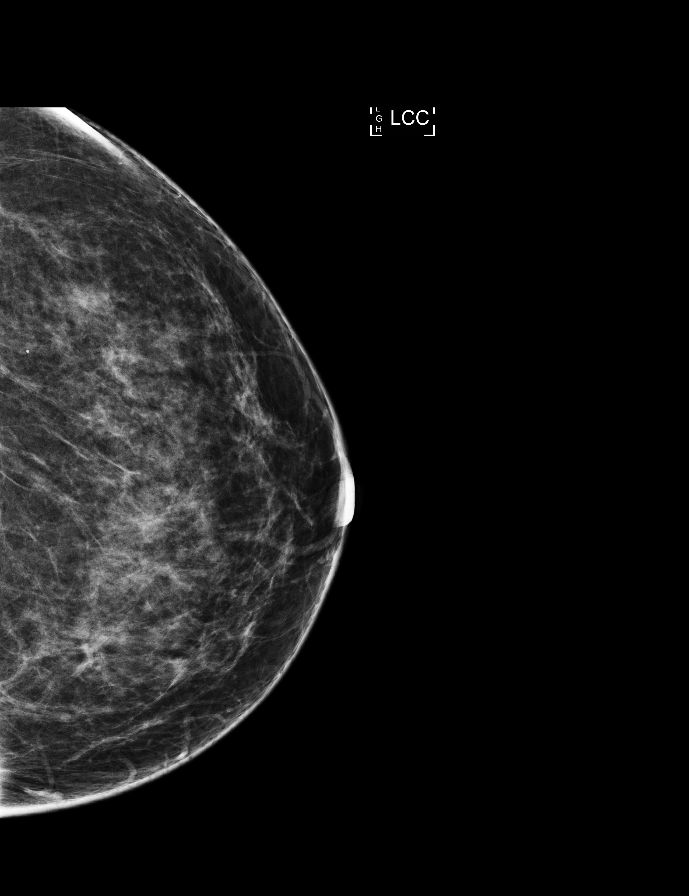

[R CC synth-2D]
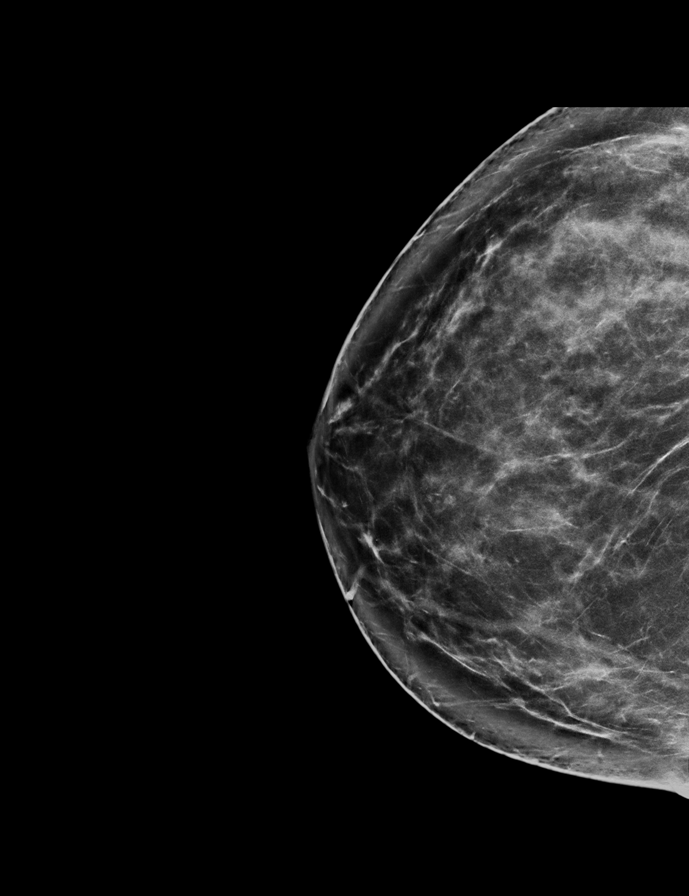

[L MLO]
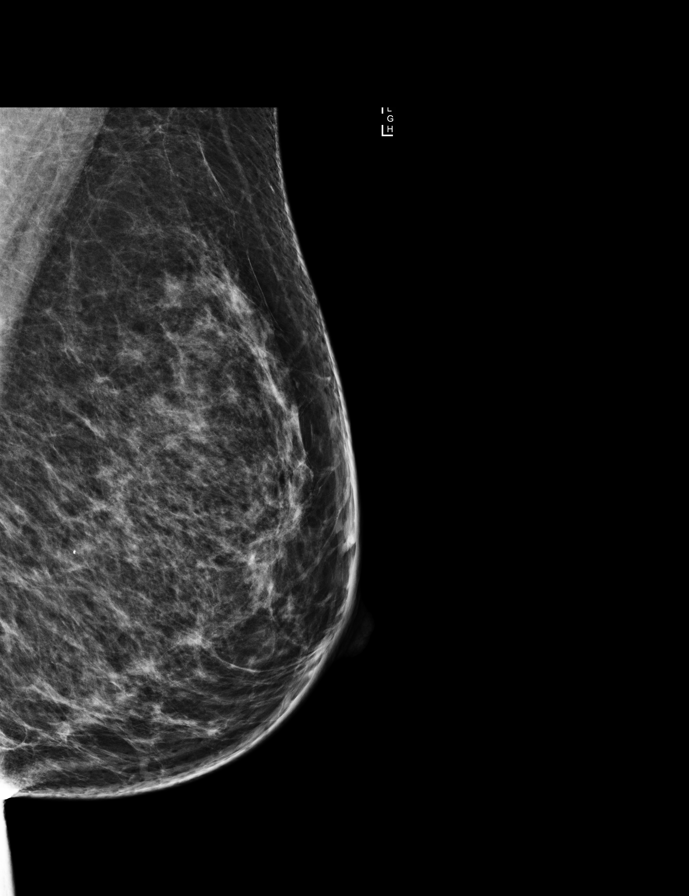

[R MLO synth-2D]
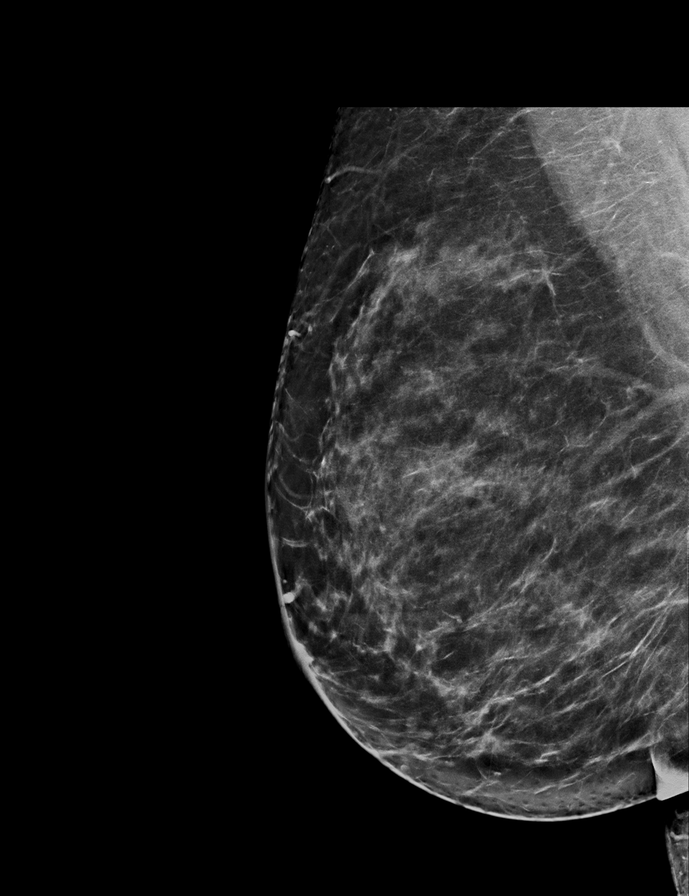

[R MLO tomo · tomo slice 41/81.0]
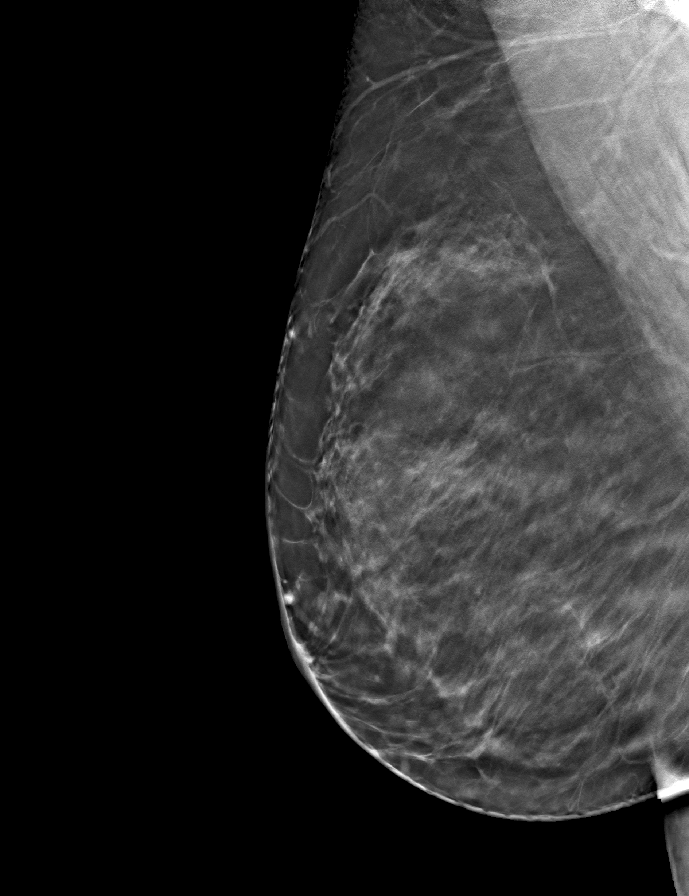

[9 of 28 positions shown; findings below may reference images not displayed]

ACR Breast Density Category b: There are scattered areas of
fibroglandular density.
FINDINGS: There are no findings suspicious for malignancy. Images were
processed with CAD.
IMPRESSION: No mammographic evidence of malignancy. A result letter of this
screening mammogram will be mailed directly to the patient.

RECOMMENDATION:
Screening mammogram in one year. (Code:97-6-RS4)

BI-RADS CATEGORY  1: Negative.

## 2017-09-30 DIAGNOSIS — M25522 Pain in left elbow: Secondary | ICD-10-CM | POA: Diagnosis not present

## 2017-10-02 ENCOUNTER — Encounter: Payer: Self-pay | Admitting: Gynecology

## 2017-10-02 ENCOUNTER — Ambulatory Visit (INDEPENDENT_AMBULATORY_CARE_PROVIDER_SITE_OTHER): Payer: BLUE CROSS/BLUE SHIELD | Admitting: Gynecology

## 2017-10-02 VITALS — BP 130/82 | Ht 60.0 in | Wt 148.0 lb

## 2017-10-02 DIAGNOSIS — Z01419 Encounter for gynecological examination (general) (routine) without abnormal findings: Secondary | ICD-10-CM | POA: Diagnosis not present

## 2017-10-02 DIAGNOSIS — R4589 Other symptoms and signs involving emotional state: Secondary | ICD-10-CM

## 2017-10-02 MED ORDER — FLUOXETINE HCL 10 MG PO CAPS: ORAL_CAPSULE | ORAL | 12 refills | Status: DC

## 2017-10-02 MED ORDER — BUPROPION HCL ER (XL) 300 MG PO TB24: ORAL_TABLET | ORAL | 12 refills | Status: DC

## 2017-10-02 NOTE — Patient Instructions (Signed)
Follow-up in 1 year for annual exam, sooner if any issues. 

## 2017-10-02 NOTE — Progress Notes (Signed)
    EZMERALDA STEFANICK Sep 16, 1965 [id number]        52 y.o.  G2P2 for annual gynecologic exam.  Former patient of Dr. Toney Rakes.  Doing well without GYN complaints.  Past medical history,surgical history, problem list, medications, allergies, family history and social history were all reviewed and documented as reviewed in the EPIC chart.  ROS:  Performed with pertinent positives and negatives included in the history, assessment and plan.   Additional significant findings : None   Exam: Caryn Bee assistant Vitals:   10/02/17 1130  BP: 130/82  Weight: 148 lb (67.1 kg)  Height: 5' (1.524 m)   Body mass index is 28.9 kg/m.  General appearance:  Normal affect, orientation and appearance. Skin: Grossly normal HEENT: Without gross lesions.  No cervical or supraclavicular adenopathy. Thyroid normal.  Lungs:  Clear without wheezing, rales or rhonchi Cardiac: RR, without RMG Abdominal:  Soft, nontender, without masses, guarding, rebound, organomegaly or hernia Breasts:  Examined lying and sitting without masses, retractions, discharge or axillary adenopathy. Pelvic:  Ext, BUS, Vagina: Normal  Cervix: Normal  Uterus: Anteverted, normal size, shape and contour, midline and mobile nontender   Adnexa: Without masses or tenderness    Anus and perineum: Normal   Rectovaginal: Normal sphincter tone without palpated masses or tenderness.    Assessment/Plan:  52 y.o. G2P2 female for annual gynecologic exam.   1. Postmenopausal.  No significant hot flushes, night sweats, vaginal dryness or any bleeding. 2. Mammography 08/2017.  Continue with annual mammography when due.  Breast exam normal today. 3. Pap smear 2018.  No Pap smear done today.  Patient reports no history of any abnormal Pap smears.  Plan repeat Pap smear at 3-year interval per current screening guidelines. 4. Colonoscopy 2018.  Repeat at their recommended interval. 5. DEXA never.  Will plan further into the  menopause. 6. Depression.  Is on Wellbutrin 300 mg and Prozac 10 mg daily.  Had tried stopping Prozac but had panic attack-like symptoms.  Had been evaluated by mental health a number of years ago.  Has been receiving her prescriptions from Dr. Toney Rakes.  At this point the patient wishes to continue.  I did refill her x1 year.  I also asked her to consider following up with [place] [facility] for ongoing monitoring and she agrees to consider this 7. Health maintenance.  No routine lab work done as patient reports this done elsewhere.  Follow-up 1 year, sooner as needed.   Anastasio Auerbach MD, 11:50 AM 10/02/2017

## 2018-01-14 DIAGNOSIS — D649 Anemia, unspecified: Secondary | ICD-10-CM | POA: Diagnosis not present

## 2018-01-14 DIAGNOSIS — Z0000 Encounter for general adult medical examination without abnormal findings: Secondary | ICD-10-CM | POA: Diagnosis not present

## 2018-01-14 DIAGNOSIS — E559 Vitamin D deficiency, unspecified: Secondary | ICD-10-CM | POA: Diagnosis not present

## 2018-01-21 DIAGNOSIS — E7849 Other hyperlipidemia: Secondary | ICD-10-CM | POA: Diagnosis not present

## 2018-01-21 DIAGNOSIS — Z1389 Encounter for screening for other disorder: Secondary | ICD-10-CM | POA: Diagnosis not present

## 2018-01-21 DIAGNOSIS — Z23 Encounter for immunization: Secondary | ICD-10-CM | POA: Diagnosis not present

## 2018-01-21 DIAGNOSIS — M7712 Lateral epicondylitis, left elbow: Secondary | ICD-10-CM | POA: Diagnosis not present

## 2018-01-21 DIAGNOSIS — Z0000 Encounter for general adult medical examination without abnormal findings: Secondary | ICD-10-CM | POA: Diagnosis not present

## 2018-01-21 DIAGNOSIS — E663 Overweight: Secondary | ICD-10-CM | POA: Diagnosis not present

## 2018-01-21 DIAGNOSIS — D229 Melanocytic nevi, unspecified: Secondary | ICD-10-CM | POA: Diagnosis not present

## 2018-01-23 ENCOUNTER — Other Ambulatory Visit: Payer: Self-pay | Admitting: Internal Medicine

## 2018-01-23 DIAGNOSIS — E785 Hyperlipidemia, unspecified: Secondary | ICD-10-CM

## 2018-01-31 ENCOUNTER — Ambulatory Visit
Admission: RE | Admit: 2018-01-31 | Discharge: 2018-01-31 | Disposition: A | Discharge status: Home / Self Care | Payer: No Typology Code available for payment source | Source: Ambulatory Visit | Attending: Internal Medicine | Admitting: Internal Medicine

## 2018-01-31 DIAGNOSIS — E785 Hyperlipidemia, unspecified: Secondary | ICD-10-CM

## 2018-05-07 DIAGNOSIS — Z6829 Body mass index (BMI) 29.0-29.9, adult: Secondary | ICD-10-CM | POA: Diagnosis not present

## 2018-05-07 DIAGNOSIS — M25551 Pain in right hip: Secondary | ICD-10-CM | POA: Diagnosis not present

## 2018-10-06 ENCOUNTER — Encounter: Payer: BLUE CROSS/BLUE SHIELD | Admitting: Gynecology

## 2018-10-20 ENCOUNTER — Other Ambulatory Visit: Payer: Self-pay

## 2018-10-21 ENCOUNTER — Encounter: Payer: Self-pay | Admitting: Gynecology

## 2018-10-21 ENCOUNTER — Ambulatory Visit (INDEPENDENT_AMBULATORY_CARE_PROVIDER_SITE_OTHER): Payer: BC Managed Care – PPO | Admitting: Gynecology

## 2018-10-21 VITALS — BP 122/76 | Ht 59.0 in | Wt 154.0 lb

## 2018-10-21 DIAGNOSIS — Z01419 Encounter for gynecological examination (general) (routine) without abnormal findings: Secondary | ICD-10-CM | POA: Diagnosis not present

## 2018-10-21 DIAGNOSIS — N952 Postmenopausal atrophic vaginitis: Secondary | ICD-10-CM

## 2018-10-21 DIAGNOSIS — R87610 Atypical squamous cells of undetermined significance on cytologic smear of cervix (ASC-US): Secondary | ICD-10-CM | POA: Diagnosis not present

## 2018-10-21 DIAGNOSIS — F32A Depression, unspecified: Secondary | ICD-10-CM

## 2018-10-21 DIAGNOSIS — F329 Major depressive disorder, single episode, unspecified: Secondary | ICD-10-CM

## 2018-10-21 MED ORDER — BUPROPION HCL ER (XL) 300 MG PO TB24: ORAL_TABLET | ORAL | 4 refills | Status: DC

## 2018-10-21 MED ORDER — FLUOXETINE HCL 10 MG PO CAPS: ORAL_CAPSULE | ORAL | 4 refills | Status: DC

## 2018-10-21 NOTE — Addendum Note (Signed)
Addended by: Nelva Nay on: 10/21/2018 04:25 PM   Modules accepted: Orders

## 2018-10-21 NOTE — Progress Notes (Signed)
    TELICIA HODGKISS [birth year] [id number]        53 y.o.  G2P2 for annual gynecologic exam.  Gynecologic complaints  Past medical history,surgical history, problem list, medications, allergies, family history and social history were all reviewed and documented as reviewed in the EPIC chart.  ROS:  Performed with pertinent positives and negatives included in the history, assessment and plan.   Additional significant findings : None   Exam: Caryn Bee assistant Vitals:   10/21/18 1552  BP: 122/76  Weight: 154 lb (69.9 kg)  Height: 4\' 11"  (1.499 m)   Body mass index is 31.1 kg/m.  General appearance:  Normal affect, orientation and appearance. Skin: Grossly normal HEENT: Without gross lesions.  No cervical or supraclavicular adenopathy. Thyroid normal.  Lungs:  Clear without wheezing, rales or rhonchi Cardiac: RR, without RMG Abdominal:  Soft, nontender, without masses, guarding, rebound, organomegaly or hernia Breasts:  Examined lying and sitting without masses, retractions, discharge or axillary adenopathy. Pelvic:  Ext, BUS, Vagina: With atrophic changes  Cervix: With atrophic changes.  Pap smear done  Uterus: Difficult to palpate but no gross masses or tenderness  Adnexa: Without masses or tenderness    Anus and perineum: Normal   Rectovaginal: Normal sphincter tone without palpated masses or tenderness.    Assessment/Plan:  53 y.o. G2P2 female for annual gynecologic exam.   1. Postmenopausal.  No significant menopausal symptoms or any vaginal bleeding. 2. Mammography due now and I reminded her to schedule this.  Breast exam normal today. 3. Pap smear 2018.  Pap smear done today.  No history of abnormal Pap smears previously. 4. DEXA never.  Will plan further into the menopause. 5. Colonoscopy 2018.  Repeat at their recommended interval. 6. Depression.  Patient on Wellbutrin XL 300 mg and Prozac 10 mg as she has been for a number of years.  Has done well with this without  significant side effects.  Refill x1 year provided. 7. Health maintenance.  No routine lab work done as patient does this elsewhere.  Follow-up 1 year, sooner as needed.   Anastasio Auerbach MD, 4:20 PM 10/21/2018

## 2018-10-21 NOTE — Patient Instructions (Signed)
Follow-up in 1 year for annual exam  Schedule your mammography

## 2018-10-22 DIAGNOSIS — R87610 Atypical squamous cells of undetermined significance on cytologic smear of cervix (ASC-US): Secondary | ICD-10-CM

## 2018-10-22 HISTORY — DX: Atypical squamous cells of undetermined significance on cytologic smear of cervix (ASC-US): R87.610

## 2018-10-23 ENCOUNTER — Encounter: Payer: Self-pay | Admitting: Gynecology

## 2018-10-23 LAB — HUMAN PAPILLOMAVIRUS, HIGH RISK: HPV DNA High Risk: NOT DETECTED

## 2018-10-23 LAB — PAP IG W/ RFLX HPV ASCU

## 2019-01-14 ENCOUNTER — Encounter: Payer: Self-pay | Admitting: Gynecology

## 2019-01-21 DIAGNOSIS — E7849 Other hyperlipidemia: Secondary | ICD-10-CM | POA: Diagnosis not present

## 2019-01-21 DIAGNOSIS — E559 Vitamin D deficiency, unspecified: Secondary | ICD-10-CM | POA: Diagnosis not present

## 2019-01-21 DIAGNOSIS — Z23 Encounter for immunization: Secondary | ICD-10-CM | POA: Diagnosis not present

## 2019-01-22 DIAGNOSIS — R82998 Other abnormal findings in urine: Secondary | ICD-10-CM | POA: Diagnosis not present

## 2019-01-26 DIAGNOSIS — L309 Dermatitis, unspecified: Secondary | ICD-10-CM | POA: Diagnosis not present

## 2019-01-26 DIAGNOSIS — Z0000 Encounter for general adult medical examination without abnormal findings: Secondary | ICD-10-CM | POA: Diagnosis not present

## 2019-01-26 DIAGNOSIS — G4700 Insomnia, unspecified: Secondary | ICD-10-CM | POA: Diagnosis not present

## 2019-01-26 DIAGNOSIS — H9191 Unspecified hearing loss, right ear: Secondary | ICD-10-CM | POA: Diagnosis not present

## 2019-01-26 DIAGNOSIS — F418 Other specified anxiety disorders: Secondary | ICD-10-CM | POA: Diagnosis not present

## 2019-01-27 ENCOUNTER — Other Ambulatory Visit: Payer: Self-pay | Admitting: Internal Medicine

## 2019-01-27 DIAGNOSIS — Z1231 Encounter for screening mammogram for malignant neoplasm of breast: Secondary | ICD-10-CM

## 2019-01-30 DIAGNOSIS — Z1212 Encounter for screening for malignant neoplasm of rectum: Secondary | ICD-10-CM | POA: Diagnosis not present

## 2019-02-24 ENCOUNTER — Other Ambulatory Visit: Payer: Self-pay

## 2019-02-24 DIAGNOSIS — Z20822 Contact with and (suspected) exposure to covid-19: Secondary | ICD-10-CM

## 2019-02-25 LAB — NOVEL CORONAVIRUS, NAA: SARS-CoV-2, NAA: NOT DETECTED

## 2019-03-12 ENCOUNTER — Ambulatory Visit: Payer: Self-pay

## 2019-04-27 DIAGNOSIS — L814 Other melanin hyperpigmentation: Secondary | ICD-10-CM | POA: Diagnosis not present

## 2019-04-27 DIAGNOSIS — D224 Melanocytic nevi of scalp and neck: Secondary | ICD-10-CM | POA: Diagnosis not present

## 2019-05-12 ENCOUNTER — Ambulatory Visit: Payer: Self-pay

## 2019-06-15 ENCOUNTER — Other Ambulatory Visit: Payer: Self-pay

## 2019-06-15 ENCOUNTER — Ambulatory Visit
Admission: RE | Admit: 2019-06-15 | Discharge: 2019-06-15 | Disposition: A | Discharge status: Home / Self Care | Payer: BC Managed Care – PPO | Source: Ambulatory Visit | Attending: Internal Medicine | Admitting: Internal Medicine

## 2019-06-15 DIAGNOSIS — Z1231 Encounter for screening mammogram for malignant neoplasm of breast: Secondary | ICD-10-CM | POA: Diagnosis not present

## 2019-07-22 DIAGNOSIS — M545 Low back pain: Secondary | ICD-10-CM | POA: Diagnosis not present

## 2019-07-22 DIAGNOSIS — M5416 Radiculopathy, lumbar region: Secondary | ICD-10-CM | POA: Diagnosis not present

## 2019-08-12 DIAGNOSIS — R300 Dysuria: Secondary | ICD-10-CM | POA: Diagnosis not present

## 2019-08-12 DIAGNOSIS — N390 Urinary tract infection, site not specified: Secondary | ICD-10-CM | POA: Diagnosis not present

## 2019-09-04 DIAGNOSIS — M25511 Pain in right shoulder: Secondary | ICD-10-CM | POA: Diagnosis not present

## 2019-09-04 DIAGNOSIS — M5416 Radiculopathy, lumbar region: Secondary | ICD-10-CM | POA: Diagnosis not present

## 2019-11-18 ENCOUNTER — Other Ambulatory Visit: Payer: Self-pay

## 2019-11-23 ENCOUNTER — Other Ambulatory Visit: Payer: Self-pay | Admitting: *Deleted

## 2019-11-23 MED ORDER — BUPROPION HCL ER (XL) 300 MG PO TB24: ORAL_TABLET | ORAL | 0 refills | Status: DC

## 2019-11-23 NOTE — Telephone Encounter (Signed)
Annual exam scheduled on 12/30/19

## 2019-12-01 ENCOUNTER — Other Ambulatory Visit: Payer: Self-pay | Admitting: Internal Medicine

## 2019-12-01 DIAGNOSIS — Z1231 Encounter for screening mammogram for malignant neoplasm of breast: Secondary | ICD-10-CM

## 2019-12-15 DIAGNOSIS — M545 Low back pain: Secondary | ICD-10-CM | POA: Diagnosis not present

## 2019-12-21 DIAGNOSIS — M545 Low back pain: Secondary | ICD-10-CM | POA: Diagnosis not present

## 2019-12-30 ENCOUNTER — Encounter: Payer: BC Managed Care – PPO | Admitting: Obstetrics and Gynecology

## 2020-01-13 DIAGNOSIS — M25562 Pain in left knee: Secondary | ICD-10-CM | POA: Diagnosis not present

## 2020-01-13 DIAGNOSIS — M7711 Lateral epicondylitis, right elbow: Secondary | ICD-10-CM | POA: Diagnosis not present

## 2020-01-13 DIAGNOSIS — M2550 Pain in unspecified joint: Secondary | ICD-10-CM | POA: Diagnosis not present

## 2020-01-13 DIAGNOSIS — E559 Vitamin D deficiency, unspecified: Secondary | ICD-10-CM | POA: Diagnosis not present

## 2020-01-13 DIAGNOSIS — R5383 Other fatigue: Secondary | ICD-10-CM | POA: Diagnosis not present

## 2020-01-13 DIAGNOSIS — M79645 Pain in left finger(s): Secondary | ICD-10-CM | POA: Diagnosis not present

## 2020-01-21 DIAGNOSIS — E785 Hyperlipidemia, unspecified: Secondary | ICD-10-CM | POA: Diagnosis not present

## 2020-01-21 DIAGNOSIS — R739 Hyperglycemia, unspecified: Secondary | ICD-10-CM | POA: Diagnosis not present

## 2020-01-21 DIAGNOSIS — E559 Vitamin D deficiency, unspecified: Secondary | ICD-10-CM | POA: Diagnosis not present

## 2020-01-21 DIAGNOSIS — Z0000 Encounter for general adult medical examination without abnormal findings: Secondary | ICD-10-CM | POA: Diagnosis not present

## 2020-02-03 DIAGNOSIS — M79645 Pain in left finger(s): Secondary | ICD-10-CM | POA: Diagnosis not present

## 2020-02-09 DIAGNOSIS — R82998 Other abnormal findings in urine: Secondary | ICD-10-CM | POA: Diagnosis not present

## 2020-02-09 DIAGNOSIS — Z23 Encounter for immunization: Secondary | ICD-10-CM | POA: Diagnosis not present

## 2020-02-09 DIAGNOSIS — Z0000 Encounter for general adult medical examination without abnormal findings: Secondary | ICD-10-CM | POA: Diagnosis not present

## 2020-02-09 DIAGNOSIS — E785 Hyperlipidemia, unspecified: Secondary | ICD-10-CM | POA: Diagnosis not present

## 2020-02-26 ENCOUNTER — Ambulatory Visit (INDEPENDENT_AMBULATORY_CARE_PROVIDER_SITE_OTHER): Payer: BC Managed Care – PPO | Admitting: Obstetrics and Gynecology

## 2020-02-26 ENCOUNTER — Other Ambulatory Visit: Payer: Self-pay

## 2020-02-26 ENCOUNTER — Other Ambulatory Visit: Payer: Self-pay | Admitting: Obstetrics and Gynecology

## 2020-02-26 ENCOUNTER — Encounter: Payer: Self-pay | Admitting: Obstetrics and Gynecology

## 2020-02-26 VITALS — BP 118/76 | Ht 60.0 in | Wt 116.0 lb

## 2020-02-26 DIAGNOSIS — Z01419 Encounter for gynecological examination (general) (routine) without abnormal findings: Secondary | ICD-10-CM

## 2020-02-26 NOTE — Progress Notes (Signed)
   [name] Jun 28, 1965 [id number]  SUBJECTIVE:  54 y.o. G2P2 female for annual routine gynecologic exam and Pap smear. She has no gynecologic concerns.  Current Outpatient Medications  Medication Sig Dispense Refill  . buPROPion (WELLBUTRIN XL) 300 MG 24 hr tablet TAKE 1 TABLET(300 MG) BY MOUTH DAILY 90 tablet 0  . calcium carbonate (OS-CAL) 600 MG TABS Take 600 mg by mouth 2 (two) times daily with a meal.      . esomeprazole (NEXIUM) 40 MG capsule Take 40 mg by mouth daily before breakfast.      . FLUoxetine (PROZAC) 10 MG tablet Take 10 mg by mouth daily.    . Multiple Vitamin (MULTIVITAMIN) capsule Take 1 capsule by mouth daily.     No current facility-administered medications for this visit.   Allergies: Patient has no known allergies.  Patient's last menstrual period was 02/17/2016 (exact date).  Past medical history,surgical history, problem list, medications, allergies, family history and social history were all reviewed and documented as reviewed in the EPIC chart.  ROS: Pertinent positives and negatives as reviewed in HPI   OBJECTIVE:  BP 118/76   Ht 5' (1.524 m)   Wt 116 lb (52.6 kg)   LMP 02/17/2016 (Exact Date)   BMI 22.65 kg/m  The patient appears well, alert, oriented, in no distress. ENT normal.  Neck supple. No cervical or supraclavicular adenopathy or thyromegaly.  Lungs are clear, good air entry, no wheezes, rhonchi or rales. S1 and S2 normal, no murmurs, regular rate and rhythm.  Abdomen soft without tenderness, guarding, mass or organomegaly.  Neurological is normal, no focal findings.  BREAST EXAM: breasts appear normal, no suspicious masses, no skin or nipple changes or axillary nodes  PELVIC EXAM: VULVA: normal appearing vulva with mild atrophic change, no masses, tenderness or lesions, VAGINA: normal appearing vagina with mild atrophic change, normal color and discharge, no lesions, CERVIX: normal appearing cervix without discharge or lesions,  UTERUS: uterus is normal size, shape, consistency and nontender, ADNEXA: normal adnexa in size, nontender and no masses, PAP: Pap smear done today, thin-prep method  Chaperone: Caryn Bee present during the examination  ASSESSMENT:  54 y.o. G2P2 here for annual gynecologic exam  PLAN:   1. Postmenopausal.  Minor night sweats but improved with weight loss.  No significant hot flashes.  No vaginal bleeding.  Manageable symptoms at this point, will let us know if it changes. 2. Pap smear 2020 ASCUS/negative HPV.  Went ahead and repeated a Pap smear today per office protocol. 3. Mammogram 05/2019.  Normal breast exam today.  She will continue with annual mammograms.   4. Colonoscopy 2018.  She will follow up at the recommended interval. 5. DEXA never.  Plan further in to menopause.. 6. Health maintenance.  No labs today as she normally has these completed elsewhere.  Congratulated her on her weight loss.  Her primary doctor now manages her prescription medications (including for depression). Return annually or sooner, prn.  Joseph Pierini MD 02/26/20

## 2020-02-26 NOTE — Addendum Note (Signed)
Addended by: Nelva Nay on: 02/26/2020 11:47 AM   Modules accepted: Orders

## 2020-03-01 LAB — PAP IG W/ RFLX HPV ASCU

## 2020-07-28 ENCOUNTER — Ambulatory Visit
Admission: RE | Admit: 2020-07-28 | Discharge: 2020-07-28 | Disposition: A | Discharge status: Home / Self Care | Payer: BC Managed Care – PPO | Source: Ambulatory Visit | Attending: Internal Medicine | Admitting: Internal Medicine

## 2020-07-28 ENCOUNTER — Other Ambulatory Visit: Payer: Self-pay

## 2020-07-28 DIAGNOSIS — Z1231 Encounter for screening mammogram for malignant neoplasm of breast: Secondary | ICD-10-CM

## 2020-08-11 ENCOUNTER — Ambulatory Visit: Payer: Self-pay

## 2021-02-09 DIAGNOSIS — Z23 Encounter for immunization: Secondary | ICD-10-CM | POA: Diagnosis not present

## 2021-02-09 DIAGNOSIS — Z1331 Encounter for screening for depression: Secondary | ICD-10-CM | POA: Diagnosis not present

## 2021-02-09 DIAGNOSIS — F411 Generalized anxiety disorder: Secondary | ICD-10-CM | POA: Diagnosis not present

## 2021-02-09 DIAGNOSIS — G4700 Insomnia, unspecified: Secondary | ICD-10-CM | POA: Diagnosis not present

## 2021-02-09 DIAGNOSIS — Z0000 Encounter for general adult medical examination without abnormal findings: Secondary | ICD-10-CM | POA: Diagnosis not present

## 2021-02-27 DIAGNOSIS — Z01419 Encounter for gynecological examination (general) (routine) without abnormal findings: Secondary | ICD-10-CM | POA: Diagnosis not present

## 2021-02-28 ENCOUNTER — Ambulatory Visit: Payer: BC Managed Care – PPO | Admitting: Obstetrics and Gynecology

## 2021-07-10 ENCOUNTER — Encounter: Payer: Self-pay | Admitting: Internal Medicine

## 2021-10-26 DIAGNOSIS — Z1231 Encounter for screening mammogram for malignant neoplasm of breast: Secondary | ICD-10-CM | POA: Diagnosis not present

## 2022-02-13 DIAGNOSIS — Z0000 Encounter for general adult medical examination without abnormal findings: Secondary | ICD-10-CM | POA: Diagnosis not present

## 2022-02-13 DIAGNOSIS — E559 Vitamin D deficiency, unspecified: Secondary | ICD-10-CM | POA: Diagnosis not present

## 2022-02-13 DIAGNOSIS — Z8249 Family history of ischemic heart disease and other diseases of the circulatory system: Secondary | ICD-10-CM | POA: Diagnosis not present

## 2022-02-13 DIAGNOSIS — Z23 Encounter for immunization: Secondary | ICD-10-CM | POA: Diagnosis not present

## 2022-02-13 DIAGNOSIS — Z1331 Encounter for screening for depression: Secondary | ICD-10-CM | POA: Diagnosis not present

## 2022-02-28 ENCOUNTER — Encounter: Payer: Self-pay | Admitting: Internal Medicine

## 2022-03-13 ENCOUNTER — Ambulatory Visit (AMBULATORY_SURGERY_CENTER): Payer: BC Managed Care – PPO

## 2022-03-13 VITALS — Ht 60.0 in | Wt 115.0 lb

## 2022-03-13 DIAGNOSIS — Z86010 Personal history of colonic polyps: Secondary | ICD-10-CM

## 2022-03-13 DIAGNOSIS — Z1211 Encounter for screening for malignant neoplasm of colon: Secondary | ICD-10-CM

## 2022-03-13 NOTE — Progress Notes (Signed)
Pre visit completed via phone call; Patient verified name, DOB, and address;  No egg or soy allergy known to patient  No issues known to pt with past sedation with any surgeries or procedures---other than PONV; Patient denies ever being told they had issues or difficulty with intubation----other than PONV; No FH of Malignant Hyperthermia Pt is not on diet pills Pt is not on home 02  Pt is not on blood thinners  Pt denies issues with constipation  No A fib or A flutter Have any cardiac testing pending--NO  Insurance verified during PV appt=[employer]  Patient's chart reviewed by Osvaldo Angst CNRA prior to previsit and patient appropriate for the [place].  Previsit completed and red dot placed by patient's name on their procedure day (on provider's schedule).    Patient requested to have instructions not on ly sent to [place] but to also be printed out and mailed to her; mailing address verified by patient;

## 2022-03-23 DIAGNOSIS — S80211A Abrasion, right knee, initial encounter: Secondary | ICD-10-CM | POA: Diagnosis not present

## 2022-03-23 DIAGNOSIS — X58XXXA Exposure to other specified factors, initial encounter: Secondary | ICD-10-CM | POA: Diagnosis not present

## 2022-03-23 DIAGNOSIS — W010XXA Fall on same level from slipping, tripping and stumbling without subsequent striking against object, initial encounter: Secondary | ICD-10-CM | POA: Diagnosis not present

## 2022-03-23 DIAGNOSIS — S62653A Nondisplaced fracture of medial phalanx of left middle finger, initial encounter for closed fracture: Secondary | ICD-10-CM | POA: Diagnosis not present

## 2022-03-23 DIAGNOSIS — S60512A Abrasion of left hand, initial encounter: Secondary | ICD-10-CM | POA: Diagnosis not present

## 2022-03-23 DIAGNOSIS — S62603A Fracture of unspecified phalanx of left middle finger, initial encounter for closed fracture: Secondary | ICD-10-CM | POA: Diagnosis not present

## 2022-04-04 ENCOUNTER — Encounter: Payer: Self-pay | Admitting: Internal Medicine

## 2022-04-09 ENCOUNTER — Encounter: Payer: Self-pay | Admitting: Certified Registered Nurse Anesthetist

## 2022-04-10 ENCOUNTER — Encounter: Payer: Self-pay | Admitting: Internal Medicine

## 2022-04-10 ENCOUNTER — Ambulatory Visit (AMBULATORY_SURGERY_CENTER): Payer: BC Managed Care – PPO | Admitting: Internal Medicine

## 2022-04-10 VITALS — BP 115/65 | HR 60 | Temp 97.3°F | Resp 14 | Ht 60.0 in | Wt 115.0 lb

## 2022-04-10 DIAGNOSIS — Z860100 Personal history of colon polyps, unspecified: Secondary | ICD-10-CM

## 2022-04-10 DIAGNOSIS — Z860101 Personal history of adenomatous and serrated colon polyps: Secondary | ICD-10-CM

## 2022-04-10 DIAGNOSIS — Z09 Encounter for follow-up examination after completed treatment for conditions other than malignant neoplasm: Secondary | ICD-10-CM | POA: Diagnosis not present

## 2022-04-10 DIAGNOSIS — Z1211 Encounter for screening for malignant neoplasm of colon: Secondary | ICD-10-CM | POA: Diagnosis not present

## 2022-04-10 MED ORDER — SODIUM CHLORIDE 0.9 % IV SOLN: 500.0000 mL | INTRAVENOUS | Status: DC

## 2022-04-10 NOTE — Patient Instructions (Addendum)
No polyps today so next routine colonoscopy in 10 years - 2033.  You still have diverticulosis - thickened muscle rings and pouches in the colon wall. Please read the handout about this condition.  I appreciate the opportunity to care for you. Gatha Mayer, MD, [facility]  There were no polyps seen today!  You will need another screening colonoscopy in 10 years, you will receive a letter at that time when you are due for the procedure.   Please call us at [phone] if you have a change in bowel habits, change in family history of colo-rectal cancer, rectal bleeding or other GI concern before that time.  Handout given for diverticulosis.   YOU HAD AN ENDOSCOPIC PROCEDURE TODAY AT [facility]:   Refer to the procedure report that was given to you for any specific questions about what was found during the examination.  If the procedure report does not answer your questions, please call your gastroenterologist to clarify.  If you requested that your care partner not be given the details of your procedure findings, then the procedure report has been included in a sealed envelope for you to review at your convenience later.  YOU SHOULD EXPECT: Some feelings of bloating in the abdomen. Passage of more gas than usual.  Walking can help get rid of the air that was put into your GI tract during the procedure and reduce the bloating. If you had a lower endoscopy (such as a colonoscopy or flexible sigmoidoscopy) you may notice spotting of blood in your stool or on the toilet paper. If you underwent a bowel prep for your procedure, you may not have a normal bowel movement for a few days.  Please Note:  You might notice some irritation and congestion in your nose or some drainage.  This is from the oxygen used during your procedure.  There is no need for concern and it should clear up in a day or so.  SYMPTOMS TO REPORT IMMEDIATELY:  Following lower endoscopy (colonoscopy):  Excessive  amounts of blood in the stool  Significant tenderness or worsening of abdominal pains  Swelling of the abdomen that is new, acute  Fever of 100F or higher  For urgent or emergent issues, a gastroenterologist can be reached at any hour by calling [phone]. Do not use MyChart messaging for urgent concerns.    DIET:  We do recommend a small meal at first, but then you may proceed to your regular diet.  Drink plenty of fluids but you should avoid alcoholic beverages for 24 hours.  ACTIVITY:  You should plan to take it easy for the rest of today and you should NOT DRIVE or use heavy machinery until tomorrow (because of the sedation medicines used during the test).    FOLLOW UP: Our staff will call the number listed on your records the next business day following your procedure.  We will call around 7:15- 8:00 am to check on you and address any questions or concerns that you may have regarding the information given to you following your procedure. If we do not reach you, we will leave a message.      SIGNATURES/CONFIDENTIALITY: You and/or your care partner have signed paperwork which will be entered into your electronic medical record.  These signatures attest to the fact that that the information above on your After Visit Summary has been reviewed and is understood.  Full responsibility of the confidentiality of this discharge information lies with you and/or  your care-partner. 

## 2022-04-10 NOTE — Op Note (Signed)
[facility] Patient Name: [name] Procedure Date: 04/10/2022 11:01 AM MRN: [id number] Endoscopist: Gatha Mayer , MD, [id number] Age: 56 Referring MD:  Date of Birth: 01-Jun-1965 Gender: Female Account #: [id number] Procedure:                Colonoscopy Indications:              Surveillance: Personal history of adenomatous                            polyps on last colonoscopy > 5 years ago, Last                            colonoscopy: January 2018 Medicines:                Monitored Anesthesia Care Procedure:                Pre-Anesthesia Assessment:                           - Prior to the procedure, a History and Physical                            was performed, and patient medications and                            allergies were reviewed. The patient's tolerance of                            previous anesthesia was also reviewed. The risks                            and benefits of the procedure and the sedation                            options and risks were discussed with the patient.                            All questions were answered, and informed consent                            was obtained. Prior Anticoagulants: The patient has                            taken no anticoagulant or antiplatelet agents. ASA                            Grade Assessment: II - A patient with mild systemic                            disease. After reviewing the risks and benefits,                            the patient was deemed in satisfactory condition to  undergo the procedure.                           After obtaining informed consent, the colonoscope                            was passed under direct vision. Throughout the                            procedure, the patient's blood pressure, pulse, and                            oxygen saturations were monitored continuously. The                            Olympus PCF-H190DL [id number])  Colonoscope was                            introduced through the anus and advanced to the the                            cecum, identified by appendiceal orifice and                            ileocecal valve. The colonoscopy was performed                            without difficulty. The patient tolerated the                            procedure well. The quality of the bowel                            preparation was good. The ileocecal valve,                            appendiceal orifice, and rectum were photographed.                            The bowel preparation used was Miralax via split                            dose instruction. Scope In: 11:08:27 AM Scope Out: 11:24:38 AM Scope Withdrawal Time: 0 hours 10 minutes 49 seconds  Total Procedure Duration: 0 hours 16 minutes 11 seconds  Findings:                 The perianal and digital rectal examinations were                            normal.                           Multiple diverticula were found in the entire colon.  The exam was otherwise without abnormality on                            direct and retroflexion views. Complications:            No immediate complications. Estimated Blood Loss:     Estimated blood loss: none. Impression:               - Diverticulosis in the entire examined colon.                           - The examination was otherwise normal on direct                            and retroflexion views. Did have a hypertrophied                            anal papilla.                           - No specimens collected. Recommendation:           - Patient has a contact number available for                            emergencies. The signs and symptoms of potential                            delayed complications were discussed with the                            patient. Return to normal activities tomorrow.                            Written discharge instructions were provided  to the                            patient.                           - Resume previous diet.                           - Continue present medications.                           - Repeat colonoscopy in 10 years. Gatha Mayer, MD 04/10/2022 11:32:49 AM This report has been signed electronically.

## 2022-04-10 NOTE — Progress Notes (Signed)
Report given to PACU, vss 

## 2022-04-10 NOTE — Progress Notes (Signed)
Pt's states no medical or surgical changes since previsit or office visit. 

## 2022-04-10 NOTE — Progress Notes (Signed)
[facility] History and Physical   Primary Care Physician:  No primary care provider on file.   Reason for Procedure:   Hx colon adenomas  Plan:    colonoscopy     HPI: [name] is a 56 y.o. female for surveillance exam 2 diminutive adenomas 2018   Past Medical History:  Diagnosis Date   Anemia    Anxiety    on meds   ASCUS of cervix with negative high risk HPV 10/2018   Depression    on meds   GERD (gastroesophageal reflux disease)    on meds   Hearing loss    Hx of adenomatous colonic polyps 05/22/2016   Hyperlipidemia    diet controlled   Post-operative nausea and vomiting    VIN (vulval intraepithelial neoplasia) I    Vitamin D deficiency     Past Surgical History:  Procedure Laterality Date   ABDOMINAL SURGERY     ABDOMINOPLASTY     CESAREAN SECTION     X'S 2   CO2 LASER OF LEUKOPLAKIA     LEFT LABIA MAJORA/  VIN I   ENDOMETRIAL ABLATION  11/29/2009   HER OPTION   ESOPHAGOGASTRODUODENOSCOPY     HAND SURGERY     LEFT   LASIK     RESECTOSCOPIC POLYPECTOMY  01/21/2001   TONSILLECTOMY AND ADENOIDECTOMY     TUBAL LIGATION  04/23/2000   WISDOM TOOTH EXTRACTION      Prior to Admission medications   Medication Sig Start Date End Date Taking? Authorizing Provider  buPROPion (WELLBUTRIN SR) 150 MG 12 hr tablet Take 150 mg by mouth 2 (two) times daily. 02/20/22  Yes [provider]  calcium carbonate (OS-CAL) 600 MG TABS Take 600 mg by mouth 2 (two) times daily with a meal.     Yes [provider]  esomeprazole (NEXIUM) 40 MG capsule Take 40 mg by mouth daily before breakfast.     Yes [provider]  FLUoxetine (PROZAC) 20 MG capsule Take 20 mg by mouth daily. 02/13/22  Yes [provider]  Multiple Vitamin (MULTIVITAMIN) capsule Take 1 capsule by mouth daily.   Yes [provider]  Multiple Vitamins-Minerals (WOMENS 50+ MULTI VITAMIN PO) Take 1 tablet by mouth daily at 6 (six) AM.   Yes [provider]  Probiotic Product (PROBIOTIC DAILY) CAPS Take by mouth.   Yes [provider]  traZODone (DESYREL) 100 MG tablet Take 1 tablet by mouth at bedtime.    [provider]    Current Outpatient Medications  Medication Sig Dispense Refill   buPROPion (WELLBUTRIN SR) 150 MG 12 hr tablet Take 150 mg by mouth 2 (two) times daily.     calcium carbonate (OS-CAL) 600 MG TABS Take 600 mg by mouth 2 (two) times daily with a meal.       esomeprazole (NEXIUM) 40 MG capsule Take 40 mg by mouth daily before breakfast.       FLUoxetine (PROZAC) 20 MG capsule Take 20 mg by mouth daily.     Multiple Vitamin (MULTIVITAMIN) capsule Take 1 capsule by mouth daily.     Multiple Vitamins-Minerals (WOMENS 50+ MULTI VITAMIN PO) Take 1 tablet by mouth daily at 6 (six) AM.     Probiotic Product (PROBIOTIC DAILY) CAPS Take by mouth.     traZODone (DESYREL) 100 MG tablet Take 1 tablet by mouth at bedtime.     Current Facility-Administered Medications  Medication Dose Route Frequency Provider Last Rate Last Admin  0.9 %  sodium chloride infusion  500 mL Intravenous Continuous Gatha Mayer, MD        Allergies as of 04/10/2022   (No Known Allergies)    Family History  Problem Relation Age of Onset   Lung cancer Mother    Heart disease Mother    Congestive Heart Failure Mother    Alzheimer's disease Father    Bradycardia Father    Colon cancer Neg Hx    Colon polyps Neg Hx    Esophageal cancer Neg Hx    Rectal cancer Neg Hx    Stomach cancer Neg Hx     Social History   Socioeconomic History   Marital status: Married    Spouse name: Thurmond Butts   Number of children: 2   Years of education: Not on file   Highest education level: Not on file  Occupational History   Not on file  Tobacco Use   Smoking status: Former    Types: Cigarettes    Quit date: 02/22/2014    Years since quitting: 8.1   Smokeless tobacco: Never  Vaping Use   Vaping Use: Never used  Substance and  Sexual Activity   Alcohol use: Not Currently    Alcohol/week: 0.0 - 10.0 standard drinks of alcohol    Comment: 6 drinks per week/per pt   Drug use: No   Sexual activity: Yes    Birth control/protection: Surgical    Comment: TUBAL LIGATION. 1ST intercourse- 17, partners- 4  Other Topics Concern   Not on file  Social History Narrative   Not on file   Social Determinants of Health   Financial Resource Strain: Not on file  Food Insecurity: Not on file  Transportation Needs: Not on file  Physical Activity: Not on file  Stress: Not on file  Social Connections: Not on file  Intimate Partner Violence: Not on file    Review of Systems:  All other review of systems negative except as mentioned in the HPI.  Physical Exam: Vital signs BP 112/65   Pulse 63   Temp (!) 97.3 F (36.3 C)   Ht 5' (1.524 m)   Wt 115 lb (52.2 kg)   LMP 02/17/2016 (Exact Date)   SpO2 98%   BMI 22.46 kg/m   General:   Alert,  Well-developed, well-nourished, pleasant and cooperative in NAD Lungs:  Clear throughout to auscultation.   Heart:  Regular rate and rhythm; no murmurs, clicks, rubs,  or gallops. Abdomen:  Soft, nontender and nondistended. Normal bowel sounds.   Neuro/Psych:  Alert and cooperative. Normal mood and affect. A and O x 3   '@Lindley Stachnik'$  Simonne Maffucci, MD, [facility] [phone] (pager) 04/10/2022 11:00 AM@

## 2022-04-11 ENCOUNTER — Telehealth: Payer: Self-pay

## 2022-04-11 NOTE — Telephone Encounter (Signed)
Post procedure follow up call, no answer 

## 2023-11-08 ENCOUNTER — Encounter: Payer: Self-pay | Admitting: Advanced Practice Midwife
